# Patient Record
Sex: Male | Born: 1955
Health system: Southern US, Community
[De-identification: ages and names within clinical notes are randomized; demographics above are authoritative.]

## PROBLEM LIST (undated history)

## (undated) DIAGNOSIS — M1990 Unspecified osteoarthritis, unspecified site: Secondary | ICD-10-CM

## (undated) DIAGNOSIS — I209 Angina pectoris, unspecified: Secondary | ICD-10-CM

## (undated) DIAGNOSIS — J449 Chronic obstructive pulmonary disease, unspecified: Secondary | ICD-10-CM

## (undated) DIAGNOSIS — C801 Malignant (primary) neoplasm, unspecified: Secondary | ICD-10-CM

## (undated) DIAGNOSIS — N189 Chronic kidney disease, unspecified: Secondary | ICD-10-CM

## (undated) DIAGNOSIS — C61 Malignant neoplasm of prostate: Secondary | ICD-10-CM

## (undated) DIAGNOSIS — G4730 Sleep apnea, unspecified: Secondary | ICD-10-CM

## (undated) DIAGNOSIS — M109 Gout, unspecified: Secondary | ICD-10-CM

## (undated) DIAGNOSIS — R252 Cramp and spasm: Secondary | ICD-10-CM

## (undated) DIAGNOSIS — I7300 Raynaud's syndrome without gangrene: Secondary | ICD-10-CM

## (undated) DIAGNOSIS — Z87442 Personal history of urinary calculi: Secondary | ICD-10-CM

## (undated) DIAGNOSIS — E785 Hyperlipidemia, unspecified: Secondary | ICD-10-CM

## (undated) HISTORY — DX: Gout, unspecified: M10.9

## (undated) HISTORY — PX: OTHER SURGICAL HISTORY: SHX169

## (undated) HISTORY — DX: Hyperlipidemia, unspecified: E78.5

## (undated) HISTORY — PX: CARDIAC CATHETERIZATION: SHX172

## (undated) HISTORY — DX: Unspecified osteoarthritis, unspecified site: M19.90

## (undated) HISTORY — DX: Sleep apnea, unspecified: G47.30

## (undated) HISTORY — DX: Angina pectoris, unspecified: I20.9

## (undated) HISTORY — PX: COLONOSCOPY: SHX174

## (undated) HISTORY — DX: Chronic obstructive pulmonary disease, unspecified: J44.9

## (undated) SURGERY — EGD (ESOPHAGOGASTRODUODENOSCOPY)
Anesthesia: Moderate Sedation

---

## 1998-03-17 DIAGNOSIS — C801 Malignant (primary) neoplasm, unspecified: Secondary | ICD-10-CM

## 1998-03-17 HISTORY — PX: OTHER SURGICAL HISTORY: SHX169

## 1998-03-17 HISTORY — DX: Malignant (primary) neoplasm, unspecified: C80.1

## 2000-10-05 ENCOUNTER — Encounter: Payer: Self-pay | Admitting: General Surgery

## 2000-10-05 ENCOUNTER — Ambulatory Visit (HOSPITAL_COMMUNITY): Admission: RE | Admit: 2000-10-05 | Discharge: 2000-10-05 | Payer: Self-pay | Admitting: General Surgery

## 2000-10-09 ENCOUNTER — Emergency Department (HOSPITAL_COMMUNITY): Admission: EM | Admit: 2000-10-09 | Discharge: 2000-10-09 | Payer: Self-pay | Admitting: Emergency Medicine

## 2000-10-09 ENCOUNTER — Encounter: Payer: Self-pay | Admitting: Urology

## 2000-10-21 ENCOUNTER — Ambulatory Visit (HOSPITAL_COMMUNITY): Admission: RE | Admit: 2000-10-21 | Discharge: 2000-10-21 | Payer: Self-pay | Admitting: Urology

## 2000-10-21 ENCOUNTER — Encounter: Payer: Self-pay | Admitting: Urology

## 2000-10-26 ENCOUNTER — Ambulatory Visit (HOSPITAL_COMMUNITY): Admission: RE | Admit: 2000-10-26 | Discharge: 2000-10-26 | Payer: Self-pay | Admitting: Urology

## 2000-10-26 ENCOUNTER — Encounter: Payer: Self-pay | Admitting: Urology

## 2000-11-05 ENCOUNTER — Emergency Department (HOSPITAL_COMMUNITY): Admission: EM | Admit: 2000-11-05 | Discharge: 2000-11-05 | Payer: Self-pay | Admitting: Emergency Medicine

## 2000-11-26 ENCOUNTER — Ambulatory Visit (HOSPITAL_COMMUNITY): Admission: RE | Admit: 2000-11-26 | Discharge: 2000-11-26 | Payer: Self-pay | Admitting: Urology

## 2000-11-26 ENCOUNTER — Encounter: Payer: Self-pay | Admitting: Urology

## 2000-11-27 ENCOUNTER — Encounter: Payer: Self-pay | Admitting: Urology

## 2000-11-27 ENCOUNTER — Ambulatory Visit (HOSPITAL_COMMUNITY): Admission: RE | Admit: 2000-11-27 | Discharge: 2000-11-27 | Payer: Self-pay | Admitting: Urology

## 2002-02-15 ENCOUNTER — Ambulatory Visit (HOSPITAL_COMMUNITY): Admission: RE | Admit: 2002-02-15 | Discharge: 2002-02-15 | Payer: Self-pay | Admitting: Internal Medicine

## 2002-09-17 ENCOUNTER — Encounter: Payer: Self-pay | Admitting: Internal Medicine

## 2002-09-17 ENCOUNTER — Encounter: Payer: Self-pay | Admitting: Emergency Medicine

## 2002-09-17 ENCOUNTER — Observation Stay (HOSPITAL_COMMUNITY): Admission: EM | Admit: 2002-09-17 | Discharge: 2002-09-18 | Payer: Self-pay | Admitting: Emergency Medicine

## 2003-02-01 ENCOUNTER — Ambulatory Visit (HOSPITAL_COMMUNITY): Admission: RE | Admit: 2003-02-01 | Discharge: 2003-02-01 | Payer: Self-pay | Admitting: Family Medicine

## 2003-06-22 ENCOUNTER — Emergency Department (HOSPITAL_COMMUNITY): Admission: EM | Admit: 2003-06-22 | Discharge: 2003-06-22 | Payer: Self-pay | Admitting: Emergency Medicine

## 2004-08-20 ENCOUNTER — Ambulatory Visit (HOSPITAL_COMMUNITY): Admission: RE | Admit: 2004-08-20 | Discharge: 2004-08-20 | Payer: Self-pay | Admitting: Family Medicine

## 2004-10-02 ENCOUNTER — Ambulatory Visit: Payer: Self-pay | Admitting: Orthopedic Surgery

## 2004-10-03 ENCOUNTER — Ambulatory Visit (HOSPITAL_COMMUNITY): Admission: RE | Admit: 2004-10-03 | Discharge: 2004-10-03 | Payer: Self-pay | Admitting: Orthopedic Surgery

## 2004-10-07 ENCOUNTER — Ambulatory Visit: Payer: Self-pay | Admitting: Orthopedic Surgery

## 2004-11-04 ENCOUNTER — Ambulatory Visit: Payer: Self-pay | Admitting: Orthopedic Surgery

## 2004-11-22 ENCOUNTER — Encounter: Admission: RE | Admit: 2004-11-22 | Discharge: 2004-11-22 | Payer: Self-pay | Admitting: Orthopedic Surgery

## 2004-12-09 ENCOUNTER — Encounter: Admission: RE | Admit: 2004-12-09 | Discharge: 2004-12-09 | Payer: Self-pay | Admitting: Orthopedic Surgery

## 2005-04-07 ENCOUNTER — Ambulatory Visit: Payer: Self-pay | Admitting: Orthopedic Surgery

## 2005-06-02 ENCOUNTER — Ambulatory Visit (HOSPITAL_COMMUNITY): Admission: RE | Admit: 2005-06-02 | Discharge: 2005-06-02 | Payer: Self-pay | Admitting: Neurosurgery

## 2005-06-03 ENCOUNTER — Encounter (HOSPITAL_COMMUNITY): Admission: RE | Admit: 2005-06-03 | Discharge: 2005-07-03 | Payer: Self-pay | Admitting: Neurosurgery

## 2005-07-07 ENCOUNTER — Encounter (HOSPITAL_COMMUNITY): Admission: RE | Admit: 2005-07-07 | Discharge: 2005-08-06 | Payer: Self-pay | Admitting: Neurosurgery

## 2005-08-04 ENCOUNTER — Ambulatory Visit (HOSPITAL_COMMUNITY): Admission: RE | Admit: 2005-08-04 | Discharge: 2005-08-04 | Payer: Self-pay | Admitting: Neurosurgery

## 2006-07-21 IMAGING — CT CT L SPINE W/ CM
4 of 12 series · 10 of 33 positions shown, 11 images · IV contrast (omnipaque)
Comparison: none

CLINICAL DATA: Right L5 radiculopathy.  Normal MRI.
 LUMBAR MYELOGRAM:
TECHNIQUE: Lumbar puncture was performed by Dr. Klever at L4-5 from a left paramedian approach.  Fluid was thin and colorless.  15 cc Omnipaque 180 was instilled into the subarachnoid space.
TECHNIQUE: Multidetector CT imaging of the lumbar spine was performed after intrathecal injection of contrast.  Multiplanar CT image reconstructions were also generated.  The lumbar spine was examined from T12 downward.

[Series 2: l-spine helical · axial · 0.27mm/px · z∈[-300,-210]mm · 2 of 110 slices shown, 3 images]
[im 37/110  soft-tissue]
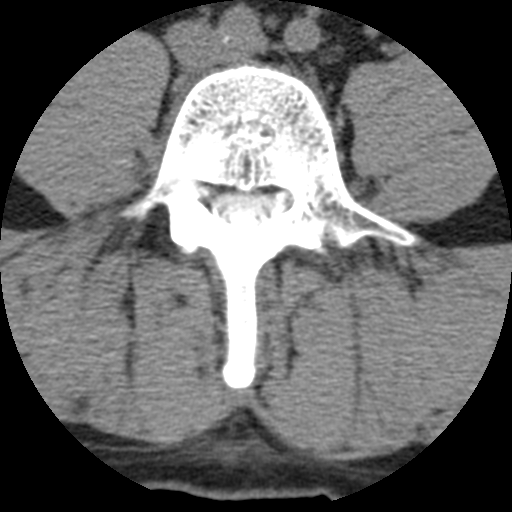
[im 37/110  bone]
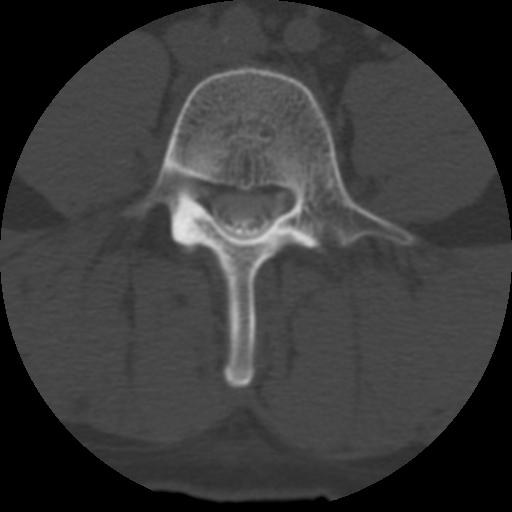
[im 73/110  bone]
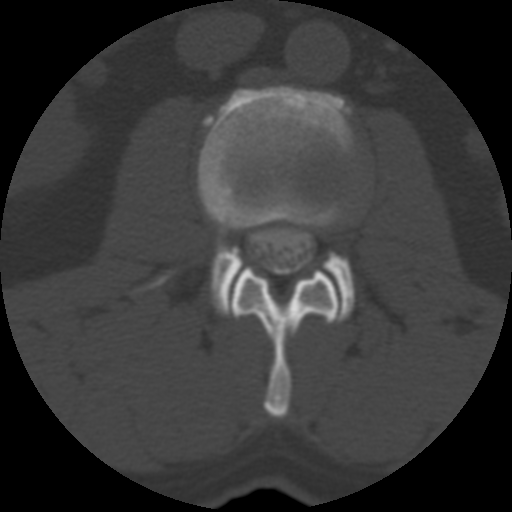

[Series 3: recon 2: l-spine helical · axial · 0.27mm/px · z∈[-300,-210]mm · 2 of 110 slices shown]
[im 37/110  bone]
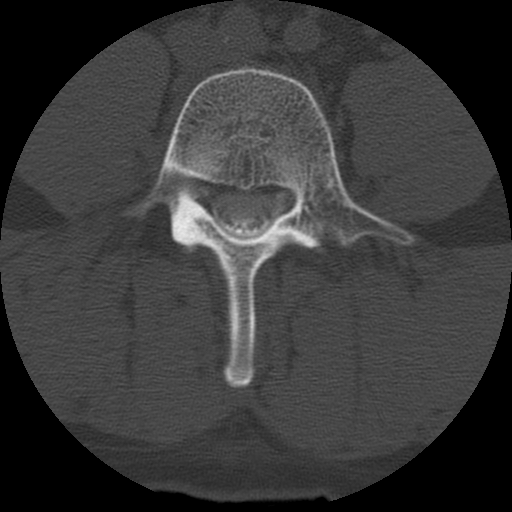
[im 73/110  bone]
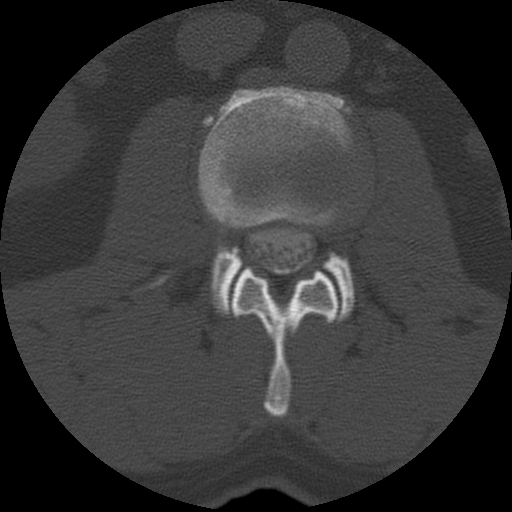

[Series 103: reformatted · sagittal · 0.55mm/px · 5 of 55 slices shown (1 of 2)]
[im 10/55  bone]
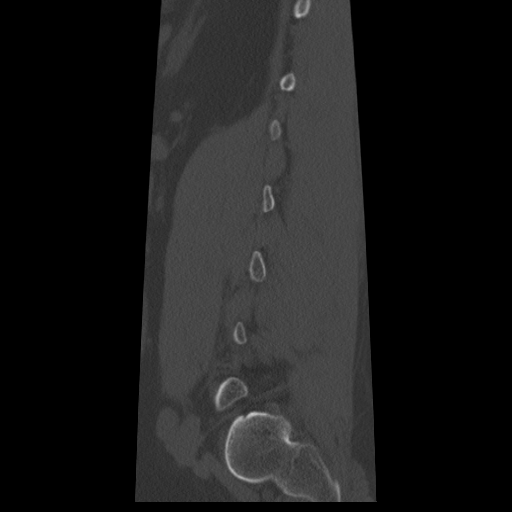
[im 19/55  bone]
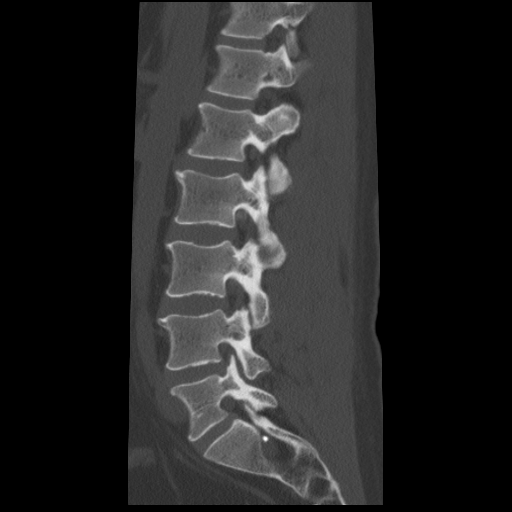
[im 28/55  bone]
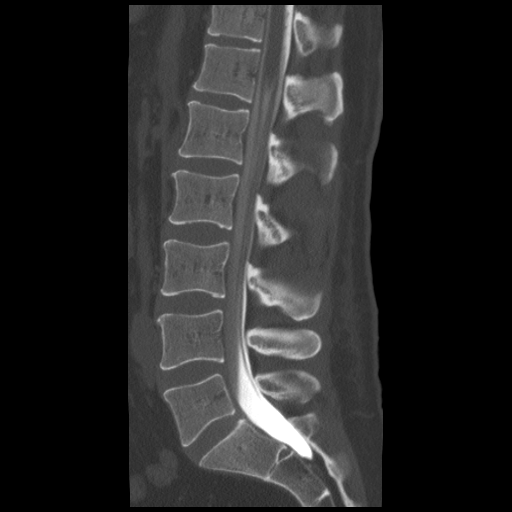
[im 37/55  bone]
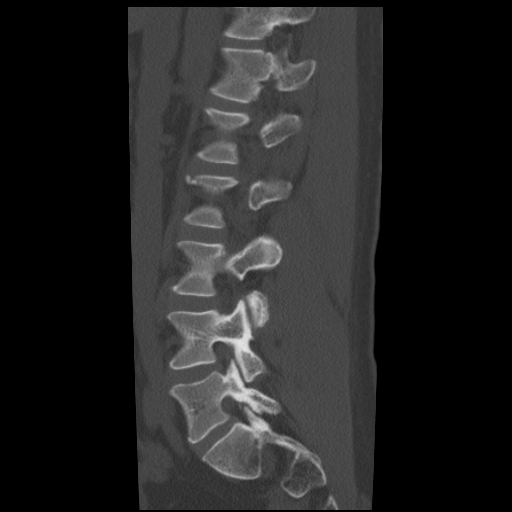
[im 46/55  bone]
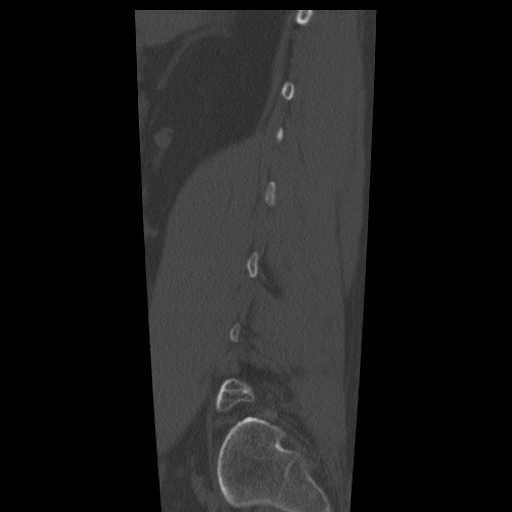

[Series 104: reformatted · coronal · 0.55mm/px · 1 of 55 slices shown (2 of 2)]
[im 28/55  bone]
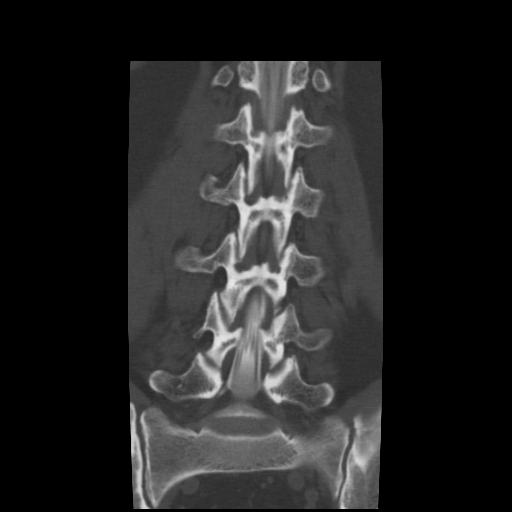

[10 of 33 positions shown; findings below may reference images not displayed]

FINDINGS: AP, lateral, and oblique views show no nerve root cutoff, spinal stenosis, or other extradural defect.  Alignment is satisfactory.  No osseous lesions are identified.
IMPRESSION: Unremarkable lumbar myelography.
 CT LUMBAR SPINE WITH CONTRAST (POST-MYELOGRAM):
FINDINGS: T12-L1:  Normal interspace. Conus normal.
 L1-2:  Normal interspace. 
 L2-3:  Normal interspace. 
 L3-4:  Normal interspace. 
 L4-5:  Normal interspace.
 L5-S1:  No stenosis or disk protrusion.  
 Far right and left parasagittal images show no foraminal narrowing or extraforaminal protrusions.
IMPRESSION: Unremarkable post-myelogram CT of the lumbar spine.

## 2007-05-30 ENCOUNTER — Encounter: Payer: Self-pay | Admitting: Orthopedic Surgery

## 2007-11-16 ENCOUNTER — Encounter: Payer: Self-pay | Admitting: Orthopedic Surgery

## 2008-01-10 ENCOUNTER — Ambulatory Visit (HOSPITAL_COMMUNITY): Admission: RE | Admit: 2008-01-10 | Discharge: 2008-01-10 | Payer: Self-pay | Admitting: Urology

## 2008-02-14 ENCOUNTER — Ambulatory Visit (HOSPITAL_COMMUNITY): Admission: RE | Admit: 2008-02-14 | Discharge: 2008-02-14 | Payer: Self-pay | Admitting: Urology

## 2008-03-01 ENCOUNTER — Ambulatory Visit (HOSPITAL_COMMUNITY): Admission: RE | Admit: 2008-03-01 | Discharge: 2008-03-01 | Payer: Self-pay | Admitting: Urology

## 2008-03-07 ENCOUNTER — Ambulatory Visit (HOSPITAL_COMMUNITY): Admission: RE | Admit: 2008-03-07 | Discharge: 2008-03-07 | Payer: Self-pay | Admitting: Urology

## 2008-05-01 ENCOUNTER — Ambulatory Visit (HOSPITAL_COMMUNITY): Admission: RE | Admit: 2008-05-01 | Discharge: 2008-05-02 | Payer: Self-pay | Admitting: Urology

## 2008-07-25 ENCOUNTER — Ambulatory Visit (HOSPITAL_COMMUNITY): Admission: RE | Admit: 2008-07-25 | Discharge: 2008-07-25 | Payer: Self-pay | Admitting: Internal Medicine

## 2008-07-26 ENCOUNTER — Ambulatory Visit (HOSPITAL_COMMUNITY): Admission: RE | Admit: 2008-07-26 | Discharge: 2008-07-26 | Payer: Self-pay | Admitting: General Surgery

## 2008-09-26 ENCOUNTER — Ambulatory Visit (HOSPITAL_COMMUNITY): Payer: Self-pay | Admitting: Psychiatry

## 2008-10-03 ENCOUNTER — Ambulatory Visit (HOSPITAL_COMMUNITY): Payer: Self-pay | Admitting: Psychology

## 2008-10-10 ENCOUNTER — Ambulatory Visit (HOSPITAL_COMMUNITY): Payer: Self-pay | Admitting: Psychiatry

## 2008-10-16 ENCOUNTER — Ambulatory Visit (HOSPITAL_COMMUNITY): Payer: Self-pay | Admitting: Psychology

## 2008-10-19 ENCOUNTER — Emergency Department (HOSPITAL_COMMUNITY): Admission: EM | Admit: 2008-10-19 | Discharge: 2008-10-19 | Payer: Self-pay | Admitting: Emergency Medicine

## 2008-11-06 ENCOUNTER — Ambulatory Visit (HOSPITAL_COMMUNITY): Payer: Self-pay | Admitting: Psychology

## 2008-11-07 ENCOUNTER — Ambulatory Visit (HOSPITAL_COMMUNITY): Payer: Self-pay | Admitting: Psychiatry

## 2008-12-07 ENCOUNTER — Ambulatory Visit (HOSPITAL_COMMUNITY): Payer: Self-pay | Admitting: Psychology

## 2008-12-21 ENCOUNTER — Ambulatory Visit (HOSPITAL_COMMUNITY): Payer: Self-pay | Admitting: Psychiatry

## 2009-01-30 IMAGING — CR DG ABDOMEN 1V
1 series · 1 of 1 positions shown · non-contrast
Comparison: 01/10/2008

CLINICAL DATA: Right renal calculus

ABDOMEN - 1 VIEW

[view not recorded]
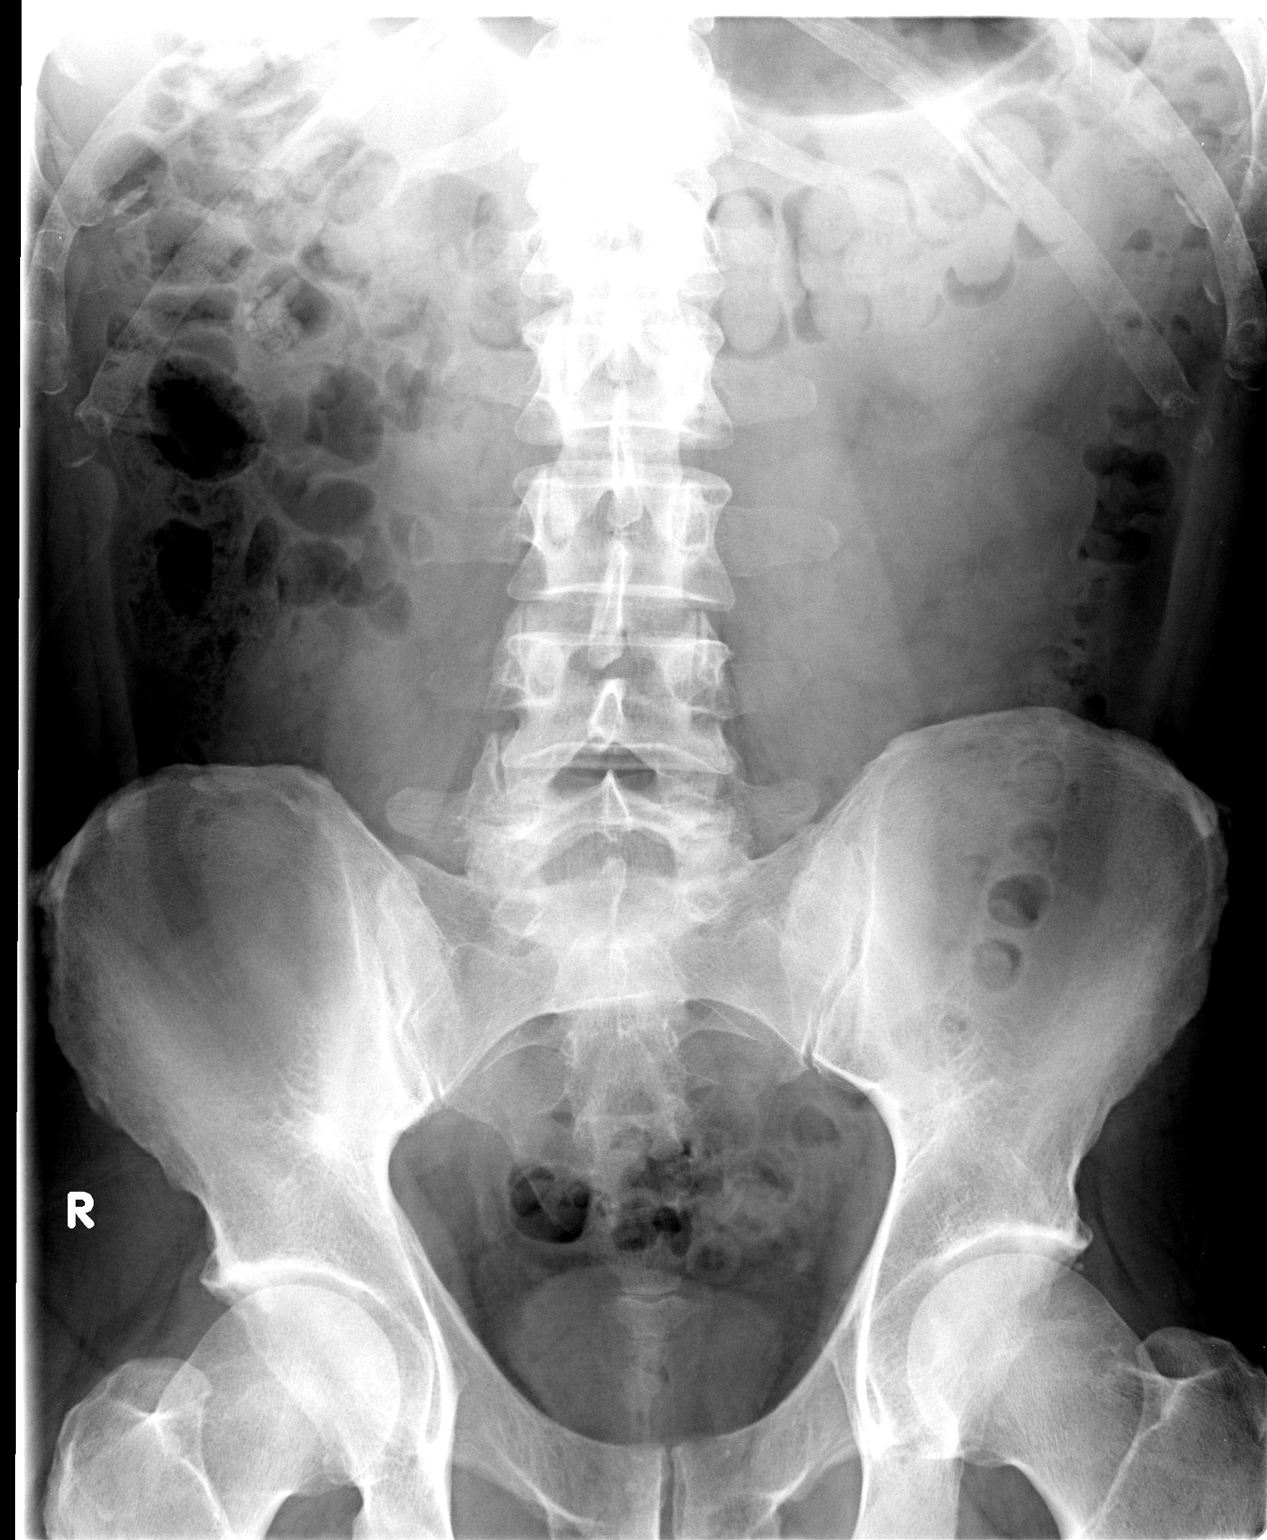

[1 of 1 positions shown; findings below may reference images not displayed]

FINDINGS: Bilateral nephrolithiasis is stable.  No
disproportionally dilated loops of small bowel.  Stool throughout
the colon is present.  No obvious free intraperitoneal gas.
IMPRESSION: Bilateral nephrolithiasis.

## 2009-02-19 ENCOUNTER — Ambulatory Visit (HOSPITAL_COMMUNITY): Payer: Self-pay | Admitting: Psychology

## 2009-02-20 ENCOUNTER — Ambulatory Visit (HOSPITAL_COMMUNITY): Payer: Self-pay | Admitting: Psychiatry

## 2009-05-14 ENCOUNTER — Ambulatory Visit (HOSPITAL_COMMUNITY): Payer: Self-pay | Admitting: Psychology

## 2009-06-11 ENCOUNTER — Ambulatory Visit (HOSPITAL_COMMUNITY): Payer: Self-pay | Admitting: Psychology

## 2009-07-11 IMAGING — CR DG HAND COMPLETE 3+V*R*
2 series · 2 of 2 positions shown · non-contrast
Comparison: None

CLINICAL DATA: Injury right hand, penetrating injury, metal shard

RIGHT HAND - COMPLETE 3+ VIEW

[view not recorded (1 of 2)]
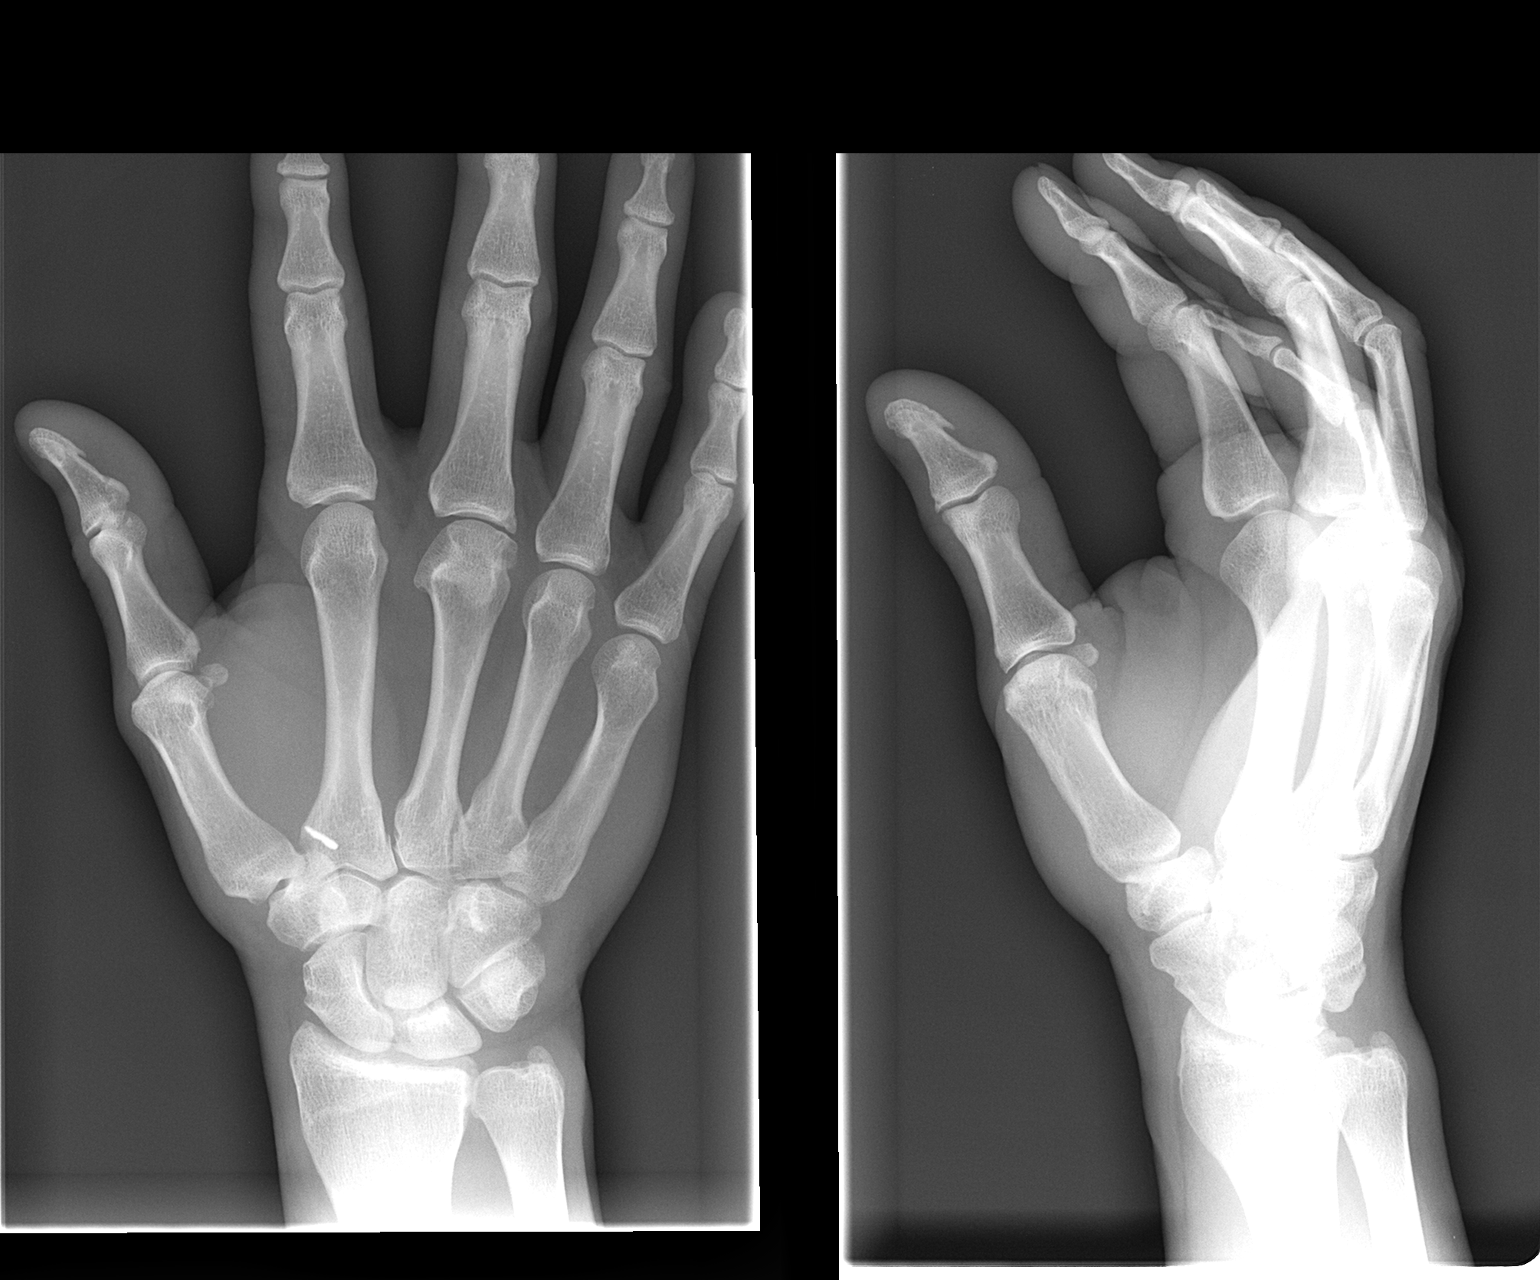

[view not recorded (2 of 2)]
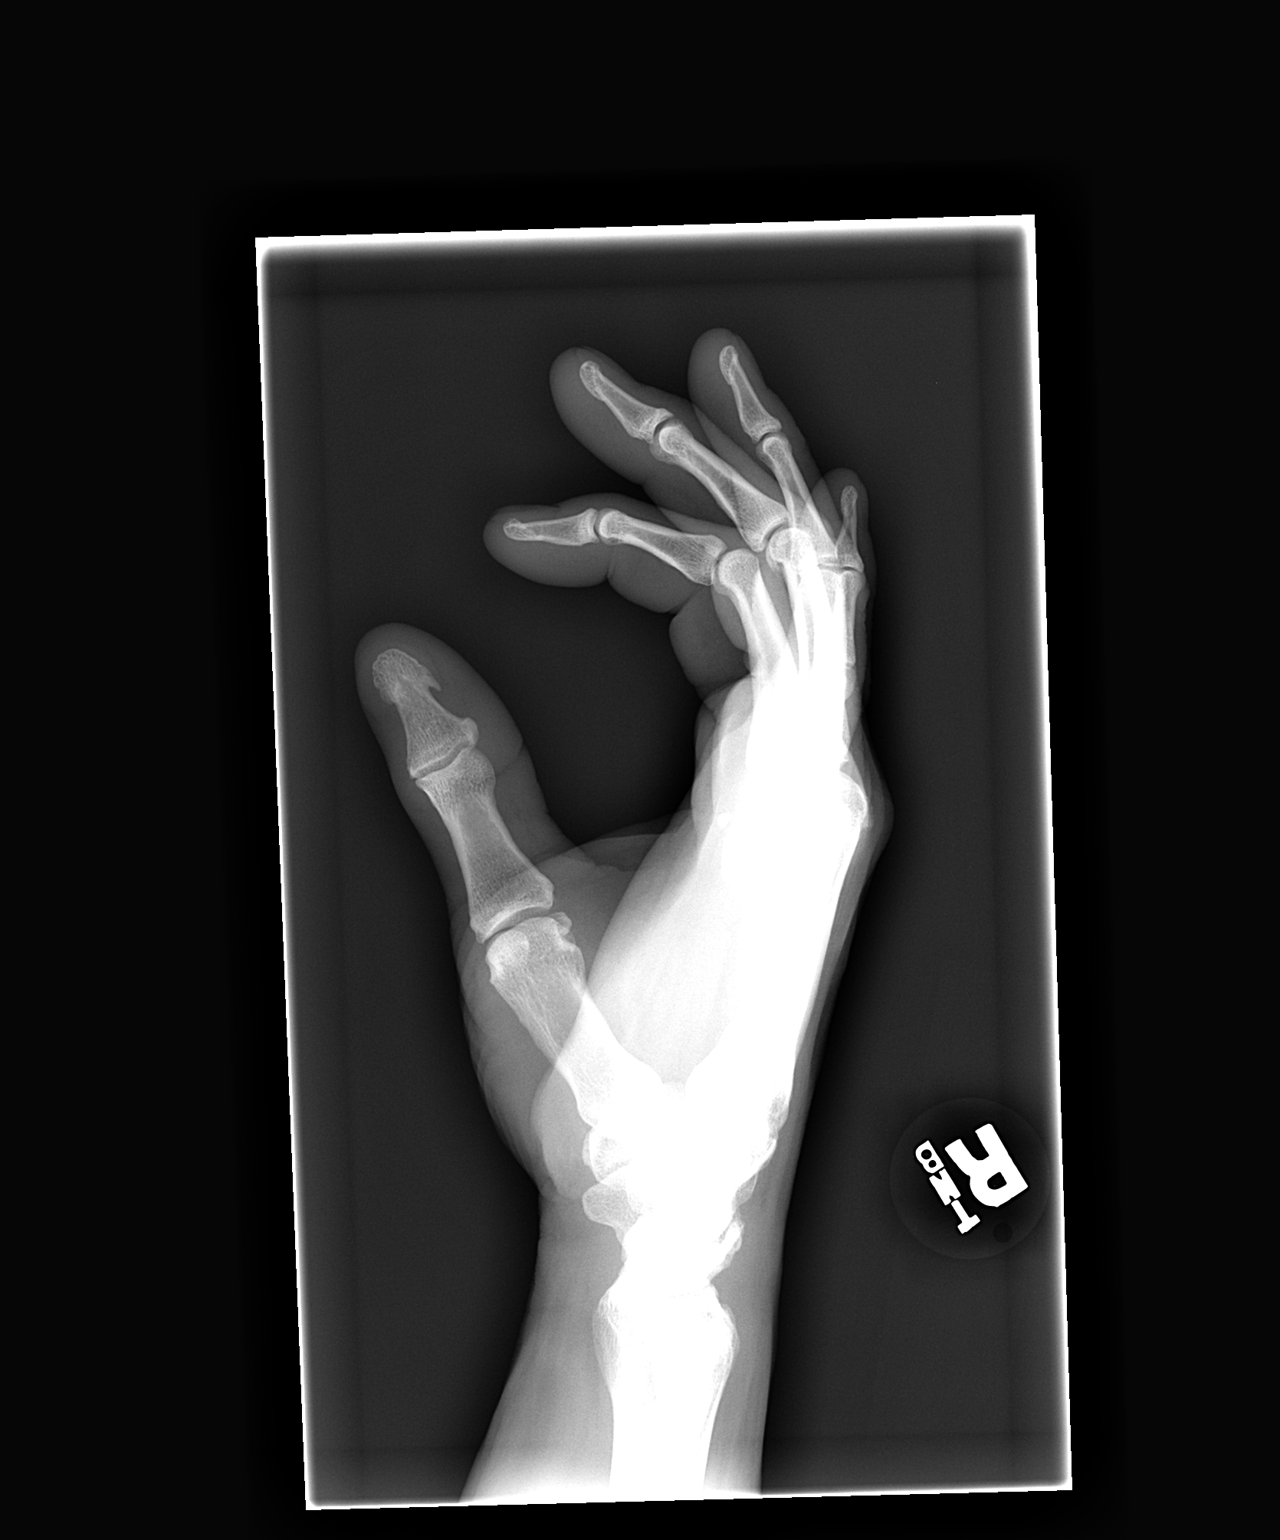

[2 of 2 positions shown; findings below may reference images not displayed]

FINDINGS: Metallic foreign body identified at radial aspect, base of second
metacarpal.
Observed foreign body measures 1.4 mm thick and 7.6 mm length.
No definite fracture, dislocation, or bone destruction.
From the obtained images, it is uncertain if the metallic fragment
is within or immediately adjacent to the base of the second
metacarpal.
Spurring versus post-traumatic deformity of third metacarpal head.
Remaining soft tissues unremarkable.
Bone mineralization normal.
IMPRESSION: Metallic foreign body at base of right second metacarpal as above.

## 2009-09-11 ENCOUNTER — Ambulatory Visit (HOSPITAL_COMMUNITY): Payer: Self-pay | Admitting: Psychology

## 2009-10-05 ENCOUNTER — Encounter: Payer: Self-pay | Admitting: Emergency Medicine

## 2009-10-05 ENCOUNTER — Emergency Department (HOSPITAL_COMMUNITY): Admission: EM | Admit: 2009-10-05 | Discharge: 2009-10-05 | Payer: Self-pay | Admitting: Emergency Medicine

## 2009-12-10 ENCOUNTER — Ambulatory Visit (HOSPITAL_COMMUNITY): Payer: Self-pay | Admitting: Psychology

## 2010-06-01 LAB — DIFFERENTIAL
Basophils Absolute: 0 10*3/uL (ref 0.0–0.1)
Eosinophils Relative: 2 % (ref 0–5)
Neutro Abs: 3.5 10*3/uL (ref 1.7–7.7)
Neutrophils Relative %: 56 % (ref 43–77)

## 2010-06-01 LAB — BASIC METABOLIC PANEL
BUN: 14 mg/dL (ref 6–23)
Chloride: 103 mEq/L (ref 96–112)
GFR calc Af Amer: >60 mL/min (ref >60)
GFR calc non Af Amer: 58 mL/min — ABNORMAL LOW (ref >60)
Sodium: 136 mEq/L (ref 135–145)

## 2010-06-01 LAB — CBC
MCV: 88.9 fL (ref 78.0–100.0)
Platelets: 273 10*3/uL (ref 150–400)
RBC: 5.35 MIL/uL (ref 4.22–5.81)
WBC: 6.2 10*3/uL (ref 4.0–10.5)

## 2010-06-25 LAB — BASIC METABOLIC PANEL
CO2: 29 mEq/L (ref 19–32)
Chloride: 104 mEq/L (ref 96–112)
GFR calc Af Amer: >60 mL/min (ref >60)
Glucose, Bld: 153 mg/dL — ABNORMAL HIGH (ref 70–99)
Sodium: 139 mEq/L (ref 135–145)

## 2010-06-25 LAB — GLUCOSE, CAPILLARY: Glucose-Capillary: 95 mg/dL (ref 70–99)

## 2010-06-25 LAB — CBC
HCT: 43.3 % (ref 39.0–52.0)
Hemoglobin: 15.1 g/dL (ref 13.0–17.0)
Platelets: 251 10*3/uL (ref 150–400)
RBC: 4.93 MIL/uL (ref 4.22–5.81)
RDW: 13.7 % (ref 11.5–15.5)
WBC: 5.7 10*3/uL (ref 4.0–10.5)

## 2010-07-02 LAB — CBC
HCT: 43.6 % (ref 39.0–52.0)
Hemoglobin: 15.1 g/dL (ref 13.0–17.0)
MCHC: 34.7 g/dL (ref 30.0–36.0)
MCV: 91.1 fL (ref 78.0–100.0)
Platelets: 280 10*3/uL (ref 150–400)
RBC: 4.78 MIL/uL (ref 4.22–5.81)
RDW: 13.5 % (ref 11.5–15.5)
WBC: 4.6 10*3/uL (ref 4.0–10.5)

## 2010-07-02 LAB — COMPREHENSIVE METABOLIC PANEL
ALT: 23 U/L (ref 0–53)
AST: 22 U/L (ref 0–37)
Albumin: 3.9 g/dL (ref 3.5–5.2)
Alkaline Phosphatase: 56 U/L (ref 39–117)
BUN: 11 mg/dL (ref 6–23)
CO2: 27 mEq/L (ref 19–32)
Calcium: 8.9 mg/dL (ref 8.4–10.5)
Chloride: 101 mEq/L (ref 96–112)
Creatinine, Ser: 1.22 mg/dL (ref 0.4–1.5)
GFR calc Af Amer: >60 mL/min (ref >60)
GFR calc non Af Amer: >60 mL/min (ref >60)
Glucose, Bld: 102 mg/dL — ABNORMAL HIGH (ref 70–99)
Potassium: 3.6 mEq/L (ref 3.5–5.1)
Sodium: 136 mEq/L (ref 135–145)
Total Bilirubin: 1.2 mg/dL (ref 0.3–1.2)
Total Protein: 6 g/dL (ref 6.0–8.3)

## 2010-07-02 LAB — URINALYSIS, ROUTINE W REFLEX MICROSCOPIC
Bilirubin Urine: NEGATIVE
Glucose, UA: NEGATIVE mg/dL
Hgb urine dipstick: NEGATIVE
Ketones, ur: NEGATIVE mg/dL
Nitrite: NEGATIVE
Protein, ur: NEGATIVE mg/dL
Specific Gravity, Urine: 1.019 (ref 1.005–1.030)
Urobilinogen, UA: 0.2 mg/dL (ref 0.0–1.0)
pH: 7 (ref 5.0–8.0)

## 2010-07-02 LAB — APTT: aPTT: 28 seconds (ref 24–37)

## 2010-07-02 LAB — PROTIME-INR
INR: 1 (ref 0.00–1.49)
Prothrombin Time: 13.5 seconds (ref 11.6–15.2)

## 2010-07-30 NOTE — H&P (Signed)
NAME:  Jordan Roth, Jordan Roth               ACCOUNT NO.:  [id number]   MEDICAL RECORD NO.:  [id number]          PATIENT TYPE:  AMB   LOCATION:  DAY                           FACILITY:  [facility]   PHYSICIAN:  Ky Barban, M.D.DATE OF BIRTH:  [birth year]   DATE OF ADMISSION:  DATE OF DISCHARGE:  LH                              HISTORY & PHYSICAL   This patient is coming tomorrow to undergo ESL for his right kidney  calculus.  This patient was treated here.  He had an ESL done for the  right renal calculus, and it is not fragmented.  We are going try to do  ESL tomorrow.   Please use the previous history and physical on this patient.      Ky Barban, M.D.  Electronically Signed     MIJ/MEDQ  D:  02/29/2008  T:  03/01/2008  Job:  [id number]

## 2010-07-30 NOTE — H&P (Signed)
[name], Roth               ACCOUNT NO.:  [id number]   MEDICAL RECORD NO.:  [id number]          PATIENT TYPE:  AMB   LOCATION:  DAY                           FACILITY:  [facility]   PHYSICIAN:  Dalia Heading, M.D.  DATE OF BIRTH:  Jul 11, 1955   DATE OF ADMISSION:  07/25/2008  DATE OF DISCHARGE:  LH                              HISTORY & PHYSICAL   CHIEF COMPLAINT:  Foreign body, right hand.   HISTORY OF PRESENT ILLNESS:  The patient is a 55 year old white male who  is referred for evaluation and treatment of a foreign body in the right  hand.  He sustained a piece of metal while hammering.  Swelling and pain  are present at the base of the thumb in the snuff-box region.  A foreign  body is confirmed by x-ray.  His tetanus is up to date.   PAST MEDICAL HISTORY:  Unremarkable.   PAST SURGICAL HISTORY:  Unremarkable.   CURRENT MEDICATIONS:  None.   ALLERGIES:  No known drug allergies.   REVIEW OF SYSTEMS:  Noncontributory.   PHYSICAL EXAMINATION:  GENERAL:  The patient is a well-developed, well-  nourished, white male in no acute distress.  LUNGS:  Clear to auscultation with equal breath sounds bilaterally.  HEART:  Reveals regular rate and rhythm without S3, S4, or murmurs.  Right hand has swelling at the base of the thumb in the snuff-box region  where a puncture wound is present.  No foreign body is palpated.   IMPRESSION:  Foreign body, right hand.   PLAN:  The patient is scheduled for removal of the foreign body, right  hand, under C-arm guidance on Jul 26, 2008.  The risks and benefits of  the procedure including bleeding and infection were fully explained to  the patient, who gave informed consent.      Dalia Heading, M.D.  Electronically Signed     MAJ/MEDQ  D:  07/25/2008  T:  07/25/2008  Job:  [id number]   cc:   [facility]  Fax: [phone]   Madelin Rear. Sherwood Gambler, MD  Fax: [phone]

## 2010-07-30 NOTE — Op Note (Signed)
Jordan Roth, Jordan Roth               ACCOUNT NO.:  [id number]   MEDICAL RECORD NO.:  [id number]          PATIENT TYPE:  OIB   LOCATION:  0098                         FACILITY:  [facility]   PHYSICIAN:  Mark C. Vernie Ammons, M.D.  DATE OF BIRTH:  1955/08/27   DATE OF PROCEDURE:  05/01/2008  DATE OF DISCHARGE:                               OPERATIVE REPORT   PREOPERATIVE DIAGNOSIS:  Right renal calculi.   POSTOPERATIVE DIAGNOSIS:  Right renal calculi.   PROCEDURE:  1. Right percutaneous nephrostomy tract dilation and placement of      percutaneous nephrostomy access sheath.  2. Right percutaneous nephrostolithotomy with stone extraction.  3. Antegrade nephrostogram with interpretation.   SURGEON:  Mark C. Vernie Ammons, M.D.   ANESTHESIA:  General.   BLOOD LOSS:  Approximately 100 mL.   SPECIMENS:  Stone given to the patient.   DRAINS:  20-French council tip catheter as a nephrostomy tube and an 36-  French Foley catheter per urethra.   COMPLICATIONS:  None.   INDICATIONS:  The patient is a 55 year old male who has known history of  bilateral renal calculi and has undergone 4 previous lithotripsies.  The  most recent lithotripsy was in 02/2008 and despite that, he continued to  have stone in the lower pole of his right kidney approximately 2 cm in  size by CT extending into the area of the renal pelvis with intermittent  pain.  Because of the stone's location in the lower pole calix, its  previous treatment with lithotripsy unsuccessfully and he has pain.  I  have discussed with the patient percutaneous nephrostolithotomy.  I went  over the procedure as well as its risks and complications and he  understands and has elected to proceed.   DESCRIPTION OF OPERATION:  After informed consent, the patient was taken  to the major OR, placed on the table and administered general  anesthesia.  He received Cipro 400 mg IV preoperatively.  Once full  general anesthesia was obtained, he was placed in  the prone position.  He had a nephrostomy catheter placed earlier in the day by  interventional radiology in the lower pole and this catheter as well as  the flank region was sterilely prepped and draped and an official time-  out was then performed.   I initially passed a 0.038 inch floppy tip guidewire through the  nephrostomy catheter under direct fluoroscopy into the area of the  bladder and confirmed this by fluoroscopy.  I then removed the  nephrostomy catheter and passed the coaxial catheter over the guidewire  again under direct fluoroscopy.  Next a second identically sized  guidewire was then passed again under fluoroscopy down into the bladder  and this was to serve as a safety guidewire.  The coaxial catheter was  removed and over the working guidewire, I passed the nephrostomy tract  dilating balloon into the area of the renal pelvis by fluoroscopic  guidance and then inflated this.  I passed the 28-French nephrostomy  access sheath over the balloon into the area of the lower pole calix and  then deflated the balloon  and removed it.   The 26-French rigid nephroscope was then passed through the access  sheath into the area lower pole and I began a systematic inspection.  I  was able to find the stone and the largest fragment was identified.  It  was grasped with three-pronged graspers and extracted.  Multiple other  smaller stones were seen and these were grasped with two-pronged  graspers.  This proceeded until all the visible stone was removed.  I  then found another area where a few more stones were identified and  these were also extracted.  I then systematically inspected the lower  pole calix and found no further stones.  I then passed the scope in the  area of the renal pelvis and again noted no stones in this location.  I  could see the guidewires passing down the ureter with no stones passing  down the ureter identified.   I removed the nephroscope and placed a  Foley catheter in the access  sheath and inflated the balloon and then injected full strength contrast  to perform an antegrade nephrostogram.  This was performed under direct  fluoroscopy injecting full strength contrast and I noted that the access  sheath was located at the area of the lower pole calix and I could not  definitely identify any further stones present.  I also noted no  evidence of extravasation of contrast.   I then deflated the balloon and advanced the catheter into the area of  the renal pelvis and confirmed this fluoroscopically by injecting  contrast.  I then filled the balloon with approximately 3 mL of half-  strength contrast in the area of the renal pelvis and confirmed this  again by injecting contrast through the nephrostomy tube.  The access  sheath was then backed out of the patient and incised along its length  and removed from the nephrostomy catheter which was then secured to the  skin with a figure-of-eight 2-0 silk suture.  This was connected to  closed system drainage and a sterile occlusive dressing was applied.  The patient was then taken to recovery room in stable satisfactory  condition.  He tolerated procedure well with no intraoperative  complications.   He will be observed overnight and his nephrostomy catheter will be  removed in the morning as long as no active bleeding is noted with plans  to discharge him home at that time.      Mark C. Vernie Ammons, M.D.  Electronically Signed     MCO/MEDQ  D:  05/01/2008  T:  05/01/2008  Job:  [id number]

## 2010-07-30 NOTE — Op Note (Signed)
NAMEDARRYN, KYDD               ACCOUNT NO.:  [id number]   MEDICAL RECORD NO.:  [id number]          PATIENT TYPE:  AMB   LOCATION:  DAY                           FACILITY:  [facility]   PHYSICIAN:  Dalia Heading, M.D.  DATE OF BIRTH:  1955-11-14   DATE OF PROCEDURE:  07/26/2008  DATE OF DISCHARGE:                               OPERATIVE REPORT   PREOPERATIVE DIAGNOSIS:  Foreign body, right hand.   POSTOPERATIVE DIAGNOSIS:  Foreign body, right hand.   PROCEDURE:  Removal of foreign body, right hand.   SURGEON:  Dalia Heading, MD   ANESTHESIA:  Regional.   INDICATIONS:  The patient is a 55 year old white male who presented with  a metallic foreign body in the right hand.  The patient now comes to the  operating room for removal of the foreign body under mini C-arm  guidance.  The risks and benefits of the procedure were fully explained  to the patient, gave informed consent.   PROCEDURE NOTE:  The patient was placed in the supine position.  After  regional anesthesia was administered, the right hand was prepped and  draped using the usual sterile technique with Betadine.  Surgical site  confirmation was performed.   Using the mini C-arm, the foreign body was localized.  An incision was  made over this region and the dissection was taken down to near the  tendon bone structure in the snuffbox.  Care was taken to avoid any  injury to vessels or tendon.  The foreign body was found and removed  without difficulty.  It was going to be given to the patient upon  discharge.  The wound was irrigated with normal saline.  Xylocaine 1%  was used for local anesthesia.  The tourniquet time was 33 minutes.  Bleeding was controlled using Bovie electrocautery.  The skin was closed  using 4-0 Prolene interrupted sutures.  Betadine ointment and dry  sterile dressing were applied.   All tape and needle counts were correct at the end of the procedure.  The patient was transferred to PACU in  stable condition.   COMPLICATIONS:  None.   SPECIMEN:  None.   BLOOD LOSS:  Minimal.      Dalia Heading, M.D.  Electronically Signed     MAJ/MEDQ  D:  07/26/2008  T:  07/26/2008  Job:  [id number]   cc:   Madelin Rear. Sherwood Gambler, MD  Fax: [phone]

## 2010-08-02 NOTE — H&P (Signed)
NAME:  Jordan Roth, Jordan Roth                         ACCOUNT NO.:  [id number]   MEDICAL RECORD NO.:  [id number]                   PATIENT TYPE:  INP   LOCATION:  A220                                 FACILITY:  [facility]   PHYSICIAN:  Madelin Rear. Sherwood Gambler, M.D.             DATE OF BIRTH:  30-Jul-1955   DATE OF ADMISSION:  09/16/2002  DATE OF DISCHARGE:                                HISTORY & PHYSICAL   CHIEF COMPLAINT:  Syncope.   HISTORY OF PRESENT ILLNESS:  Patient was in his usual state of health,  feeling fine, ate some dinner and had several drinks approximately 4-5 of  different alcoholic beverages.  He states that coincidental with possibly  some epigastric severe abdominal pain he had a short episode of small emesis  and had two possibly three separate syncopal episodes described as follows.  He had the syncopal episodes after making a telephone call and arising from  a chair where he did completely go unconscious.  He got up and then  developed some epigastric abdominal pain and no chest pain or palpitations  or shortness of breath and then he had a true syncopal episode at that time.  He states he had a third episode and possibly a fourth en route via  ambulance.  The ACR sheet is not available for my review to confirm this.  During my actual presence the patient had minimal symptoms with epigastric  discomfort but no chest pain.  He states that the day before yesterday he  had some right ear pain and possibly a little bit of vertigo but that is  gone at the present time.  Otherwise he denies any other discomfort.   PAST MEDICAL HISTORY:  Status post Barrett's esophagitis and fundoplication  for severe gastroesophageal reflux disease unresponsive to medical therapy.  He states the surgery was done about five years ago.  He has had no problem  since.   SOCIAL HISTORY:  He admits to alcohol intake.  He is married, lives with his  wife.   FAMILY HISTORY:  Noncontributory.   REVIEW OF  SYSTEMS:  Under HPI.  All negative.  Specifically denied any  hematemesis, hematochezia or melena.  No fever, rigors or chills.  No chest  pain or palpitations.   PHYSICAL EXAMINATION:  GENERAL:  He is awake, alert and appears comfortable.  HEAD/NECK:  Show no JVD or adenopathy.  Neck was supple.  CHEST:  Clear.  CARDIAC:  Regular rhythm without murmur, gallop or rub.  ABDOMEN:  Soft.  There is no organomegaly or masses.  No guarding or rebound  tenderness.  EXTREMITIES:  Without clubbing, cyanosis or edema.  NEUROLOGIC:  Nonfocal.   Acute abdominal series was reviewed and reveals a nonspecific gas pattern  without any evidence of obstruction.  Chest x-ray was clear.   EKG was normal.   Electrolytes and CBC were unrevealing.  Amylase and lipase normal.  A  liver  profile is pending at the present time as are his cardiac enzymes.   IMPRESSION:  Syncope superimposed on abdominal pain probably vagal.   Patient will be admitted with serial cardiac enzymes, telemetry monitoring,  rule out of a GI bleed and/or alcoholic gastritis, symptomatic therapy,  supportive measures, consultation of gastroenterology is anticipated as well  as following hemoccult stools.                                                Madelin Rear. Sherwood Gambler, M.D.    LJF/MEDQ  D:  09/17/2002  T:  09/17/2002  Job:  [id number]

## 2010-08-02 NOTE — Consult Note (Signed)
NAME:  Jordan, Roth                         ACCOUNT NO.:  [id number]   MEDICAL RECORD NO.:  [id number]                   PATIENT TYPE:  INP   LOCATION:  A220                                 FACILITY:  [facility]   PHYSICIAN:  Lionel December, M.D.                 DATE OF BIRTH:  [birth year]   DATE OF CONSULTATION:  09/17/2002  DATE OF DISCHARGE:                                   CONSULTATION   REASON FOR CONSULTATION:  Epigastric pain.   HISTORY OF PRESENT ILLNESS:  Jordan Roth is a 55 year old Caucasian male who was  admitted to Dr. Sharyon Medicus service earlier today via the emergency room where  he had presented around midnight, transported via EMS, ___________ with a  sudden onset of epigastric pain associated with nausea, heaving and  vomiting.  During this process he also had syncopal episodes.  Evaluation in  the emergency room revealed a normal troponin level and EKG.  His lab  studies were normal except mildly elevated AST.  He was hospitalized for  further management.  He has not had any rhythm problems or hypertension  since admission.  Patient states that he was fine all day yesterday.  He had  two hot dogs while at work.  He ate his supper later in the evening.  He had  a few drinks of alcohol.  At around 10:30 he noted epigastric pain which was  more or less sudden in onset and quite severe.  He felt very nauseated and  heaved multiple times and vomited a scant amount of food or fluid.  He  subsequently had at least two episodes of syncope.  Paramedics were called  and he was brought to the emergency room.  He has not had any more episodes  of syncope, nausea, vomiting.  He is sore in his epigastrium which he  believes is due to heaving and retching.  He was able to keep his breakfast  down.  Two days ago he noticed some right ear discomfort, however, he has  not had any headache, fever or chills.  He denies hematemesis, melena,  rectal bleeding, dysuria or hematuria.  He has a  history of kidney stones  but this pain is different then the pain that he had then.   He does not take any medications on a regular basis.  He is presently on  Protonix 40 mg IV daily and Phenergan 25 mg q.6h.   REVIEW OF SYSTEMS:  Negative for weight loss, heartburn, dysphagia, chest  pain or shortness of breath.   PAST MEDICAL HISTORY:  1. History of GERD.  2. He had lap Nissen at ___________ five years ago and he has done well.  3. He has had short segment Barrett's esophagus.  4. Last EGD was in December 2003 and biopsy was negative for dysplasia.  5. He has had laparoscopic cholecystectomy prior to his lap Nissen.  6. He has  had surgery on his right foot, right elbow.  7. He also had a tonsillectomy when he was 55 years old.  8. He has a history of kidney stones.  9. He had lithotripsy x 2 last year.  His stones were crushed but he did not     get rid of them.  He decided to just wait and see.   He saw Dr. Duanne Guess.  He decided he would not undergo further studies unless  his symptoms came back or he developed hematuria.   ALLERGIES:  None known.   FAMILY HISTORY:  Noncontributory.   SOCIAL HISTORY:  He is married.  He does Holiday representative work.  He does not  smoke cigarettes.  He drinks beer, not daily, but on Sundays he may drink as  many as 8, 9, 10 according to his wife; however, he feels that he drinks no  more then 6-7.   PHYSICAL EXAMINATION:  GENERAL:  A pleasant well-developed, well-nourished,  Caucasian male who is in no acute distress.  VITAL SIGNS:  He weighs 185.1 pounds.  He is 5 feet 10 inches tall.  Pulse  55 per minute.  Blood pressure 99/55.  Respirations 20 and temp is 97.2.  HEENT:  Conjunctivae pink.  Sclerae nonicteric.  Oropharyngeal mucosa is  normal.  NECK:  Without masses or thyromegaly.  CARDIAC:  Regular rate and normal S1, S2.  No murmur or gallop noted.  LUNGS:  Clear to auscultation.  ABDOMEN:  Symmetrical bowel sounds.  They are normal.  He  has mild  midepigastric tenderness on deep palpation.  No organomegaly or masses.  RECTAL:  Deferred.  EXTREMITIES:  He does not have clubbing or peripheral edema.   LABORATORY DATA:  On admission WBCs 6.4, H&H 14.1 and 41.2, platelet count  256 K.  He had 74 segs, 0 bands.  WBC today is 5.7.  Sodium is 140,  potassium 3.3, chloride 111, CO2 23, glucose 110, BUN 12, creatinine 1.1.  Serum potassium today is 2.8.  Bilirubin on admission 0.6, AP 72, AST 38,  ALT 30.  Total protein 6.0.  Albumin 3.8.  Calcium 8.4.  His amylase was 77,  lipase 40.   He had abdominal CT today which showed mild diverticulosis, bilateral kidney  stones without evidence of hydronephrosis.  Pancreas looks normal.  The bile  duct is not dilated.  He does not have any free fluid in his abdomen.   ASSESSMENT:  Acute illness - most likely on the basis of acute  gastroenteritis which could be food bolus, viral infection or perhaps  related to alcoholic gastritis.  I feel this syncope would appear to be due  to vasovagal reflux.  His AST is mildly elevated.  I feel this may be  indicative of mild or subacute hepatitis on the basis of excessive alcohol  use.  He could also have a biliary colic but I would expect to see numbers  go up, given excruciating pain that he had.  The patient had what appears to  be very violent significant heaving and I hope that he has not undone his  Nissen's route.   RECOMMENDATIONS:  1. We will repeat a CBC with diff in the a.m. along with LFTs.  I have     suggested to the patient that he should try to keep his alcohol intake to     no more than two drinks per day.  2. Patient will be re-evaluated in the a.m.   I would like  to thank Dr. Sherwood Gambler for the opportunity to participate in the  care of this gentleman.                                                Lionel December, M.D.   NR/MEDQ  D:  09/17/2002  T:  09/17/2002  Job:  [id number]

## 2010-08-02 NOTE — Op Note (Signed)
NAME:  Jordan Roth, Jordan Roth                         ACCOUNT NO.:  [id number]   MEDICAL RECORD NO.:  [id number]                   PATIENT TYPE:  AMB   LOCATION:  DAY                                  FACILITY:  [facility]   PHYSICIAN:  Lionel December, M.D.                 DATE OF BIRTH:  December 25, 1955   DATE OF PROCEDURE:  02/15/2002  DATE OF DISCHARGE:                                 OPERATIVE REPORT   PROCEDURE:  Esophagogastroduodenoscopy.   ENDOSCOPIST:  Lionel December, M.D.   INDICATIONS:  This patient is a 55 year old Caucasian male who has a history  of GERD complicated by Barrett's esophagus.  He had laparoscopic Nissen in  January 1999 and symptoms were well controlled.  His last surveillance exam  was in January 2000.  While he has not experienced any heartburn or  regurgitation he complains of dysphagia and he points to his throat.  He  feels that this is due to a problem and he has seen an ENT specialist and  nasal spray, etc., has not helped him.  He does not feel that this dysphagia  is esophageal.  He is undergoing an EGD with biopsy from his distal  esophagus or Barrett's segment.  The procedure and risks were reviewed with  the patient and informed consent was obtained.   PREOPERATIVE MEDICATIONS:  Cetacaine spray for pharyngeal topical  anesthesia, Demerol 50 mg IV and Versed 6 mg IV in divided dose.   INSTRUMENT:  Olympus video system.   FINDINGS:  Procedure performed in endoscopy suite.  The patient's vital  signs and O2 saturation were monitored during the procedure and remained  stable.  The patient was placed in the left lateral recumbent position and  endoscope was passed via the oropharynx without any difficulty into the  esophagus.   ESOPHAGUS:  Mucosa of the proximal and middle third was normal.  He had 2  islands of gastric type mucosa.  He had short segment of Barrett's esophagus  with very serrated margins.  The junction between the squamous and Barrett's  segment is at 38 cm.  There were no ulcers or a mass.  Multiple pictures  were taken for the record and biopsies were taken on the way out.   STOMACH:  It was empty and distended very well with insufflation.  The folds  of the proximal stomach were normal.  Examination of the mucosa, body,  antrum, pyloric channel, as well as angularis was normal.  Wrap was viewed  on the reflex view.  It was a fairly long wrap.   DUODENUM:  Examination of the bulb and second part of the duodenum was  normal.   As noted above multiple biopsies were taken from the Barrett's mucosa and  submitted in 1 container.  Esophagus was reexamined on the way out and he  had 3 very small islands of ectopic gastric mucosa in the cervical esophagus  but there was no ring or stricture.   The endoscope was withdrawn.  The patient tolerated the procedure well.   FINAL DIAGNOSES:  1. Short segment Barrett's esophagus which appears to be stable.  Comparison     with pictures from last exam as soon as they are available.  2. Fairly long intact, fundal wraps.  3. No abnormality noted in the cervical esophagus to account for his     dysphagia.   RECOMMENDATIONS:  1. I will contact the patient with biopsy results. If biopsies are negative     for dysplasia may consider repeat exam in 2-4 years.  2. If he remains with dysphagia, I would recommend barium study with the     barium pill.                                               Lionel December, M.D.    NR/MEDQ  D:  02/15/2002  T:  02/15/2002  Job:  [id number]   cc:   Kirk Ruths, M.D.  [address]  [place]  [place] [address]  Fax: [phone]

## 2014-10-05 ENCOUNTER — Encounter (INDEPENDENT_AMBULATORY_CARE_PROVIDER_SITE_OTHER): Payer: Self-pay

## 2014-10-05 ENCOUNTER — Encounter (INDEPENDENT_AMBULATORY_CARE_PROVIDER_SITE_OTHER): Payer: Self-pay | Admitting: *Deleted

## 2015-03-18 DIAGNOSIS — I209 Angina pectoris, unspecified: Secondary | ICD-10-CM

## 2015-03-18 HISTORY — DX: Angina pectoris, unspecified: I20.9

## 2015-07-20 ENCOUNTER — Other Ambulatory Visit (HOSPITAL_COMMUNITY): Payer: Self-pay | Admitting: Internal Medicine

## 2015-07-20 ENCOUNTER — Ambulatory Visit (HOSPITAL_COMMUNITY)
Admission: RE | Admit: 2015-07-20 | Discharge: 2015-07-20 | Disposition: A | Discharge status: Home / Self Care | Payer: 59 | Source: Ambulatory Visit | Attending: Internal Medicine | Admitting: Internal Medicine

## 2015-07-20 DIAGNOSIS — M25561 Pain in right knee: Secondary | ICD-10-CM | POA: Insufficient documentation

## 2015-07-20 DIAGNOSIS — M25461 Effusion, right knee: Secondary | ICD-10-CM | POA: Insufficient documentation

## 2015-12-20 DIAGNOSIS — E785 Hyperlipidemia, unspecified: Secondary | ICD-10-CM | POA: Insufficient documentation

## 2015-12-20 DIAGNOSIS — J449 Chronic obstructive pulmonary disease, unspecified: Secondary | ICD-10-CM

## 2015-12-20 DIAGNOSIS — G4730 Sleep apnea, unspecified: Secondary | ICD-10-CM | POA: Insufficient documentation

## 2015-12-20 DIAGNOSIS — I209 Angina pectoris, unspecified: Secondary | ICD-10-CM

## 2015-12-20 DIAGNOSIS — M109 Gout, unspecified: Secondary | ICD-10-CM | POA: Insufficient documentation

## 2015-12-21 ENCOUNTER — Encounter: Payer: Self-pay | Admitting: Cardiology

## 2015-12-21 ENCOUNTER — Ambulatory Visit (INDEPENDENT_AMBULATORY_CARE_PROVIDER_SITE_OTHER): Payer: 59 | Admitting: Cardiology

## 2015-12-21 VITALS — BP 112/62 | HR 72 | Ht 71.0 in | Wt 184.0 lb

## 2015-12-21 DIAGNOSIS — R0609 Other forms of dyspnea: Secondary | ICD-10-CM

## 2015-12-21 DIAGNOSIS — R0789 Other chest pain: Secondary | ICD-10-CM | POA: Diagnosis not present

## 2015-12-21 NOTE — Patient Instructions (Signed)
Medication Instructions:  Your physician recommends that you continue on your current medications as directed. Please refer to the Current Medication list given to you today.   Labwork: WE WILL REQUEST LABS FROM PCP  Testing/Procedures: Your physician has requested that you have an echocardiogram. Echocardiography is a painless test that uses sound waves to create images of your heart. It provides your doctor with information about the size and shape of your heart and how well your heart's chambers and valves are working. This procedure takes approximately one hour. There are no restrictions for this procedure.    Follow-Up: Your physician recommends that you schedule a follow-up appointment in: TO BE DETERMINED BASED ON TEST RESULTS   Any Other Special Instructions Will Be Listed Below (If Applicable).     If you need a refill on your cardiac medications before your next appointment, please call your pharmacy.

## 2015-12-21 NOTE — Progress Notes (Signed)
Clinical Summary Jordan Roth is a 60 y.o.male seen today as a new patient.He is referred by Dr Nevada Crane.   1. DOE - episode of DOE with lower levels of exertion. 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 Can have occasoinal episodes of palpitations, sharp stinging chest pain at times.  - no recent LE edema. No orthopnea or PND.  - chronic right knee pain, being considered for knee surgery. CAD risk factors: HL, remote tobacco history x 5, father MI 35s, paternal grandfather MI early 80s.    2. OSA - still using CPAP  3. Preoperative evaluation - he is being considered for knee surgery, seeking cardiac clearance.     Past Medical History:  Diagnosis Date  . Angina pectoris ([place])   . COPD (chronic obstructive pulmonary disease) ([place])   . Gout   . Hyperlipidemia   . Osteoarthrosis   . Sleep apnea      Allergies no known allergies   Current Outpatient Prescriptions  Medication Sig Dispense Refill  . aspirin EC 81 MG tablet Take 81 mg by mouth daily.    Marland Kitchen atorvastatin (LIPITOR) 10 MG tablet Take 10 mg by mouth daily.    . Omega-3 Fatty Acids (FISH OIL) 1000 MG CAPS Take 1,000 mg by mouth daily.     No current facility-administered medications for this visit.      Past Surgical History:  Procedure Laterality Date  . COLONOSCOPY       Allergies no known allergies    Family History  Problem Relation Age of Onset  . Family history unknown: Yes     Social History Jordan Roth reports that he has quit smoking. He has never used smokeless tobacco. Jordan Roth reports that he does not drink alcohol.   Review of Systems CONSTITUTIONAL: No weight loss, fever, chills, weakness or fatigue.  HEENT: Eyes: No visual loss, blurred vision, double vision or yellow sclerae.No hearing loss, sneezing, congestion, runny nose or sore throat.  SKIN: No rash or itching.  CARDIOVASCULAR: per HPI RESPIRATORY: per hPI GASTROINTESTINAL: No anorexia, nausea, vomiting or  diarrhea. No abdominal pain or blood.  GENITOURINARY: No burning on urination, no polyuria NEUROLOGICAL: No headache, dizziness, syncope, paralysis, ataxia, numbness or tingling in the extremities. No change in bowel or bladder control.  MUSCULOSKELETAL:chronic knee pain  LYMPHATICS: No enlarged nodes. No history of splenectomy.  PSYCHIATRIC: No history of depression or anxiety.  ENDOCRINOLOGIC: No reports of sweating, cold or heat intolerance. No polyuria or polydipsia.  Marland Kitchen   Physical Examination Vitals:   12/21/15 1329  BP: 112/62  Pulse: 72   Vitals:   12/21/15 1329  Weight: 184 lb (83.5 kg)  Height: 5\' 11"  (1.803 m)    Gen: resting comfortably, no acute distress HEENT: no scleral icterus, pupils equal round and reactive, no palptable cervical adenopathy,  CV: RRR, no m/r/g, no jvd Resp: Clear to auscultation bilaterally GI: abdomen is soft, non-tender, non-distended, normal bowel sounds, no hepatosplenomegaly MSK: extremities are warm, no edema.  Skin: warm, no rash Neuro:  no focal deficits Psych: appropriate affect      Assessment and Plan  1. DOE - unclear etiology. We will obtain echo to evaluate for possible structural heart disease as the cause - pending echo results, consider GXT to evaluate for ischemic heart disease and quantify symptoms of DOE.  - pending cardiac workup, consider cardiac clearance for possible knee surgery - baseline EKG form pcp's office reviewed, sinus brady 55 without ischemic changes.  F/u pending echo results. After echo likely obtain GXT        Arnoldo Lenis, M.D.,

## 2016-01-01 ENCOUNTER — Ambulatory Visit (HOSPITAL_COMMUNITY)
Admission: RE | Admit: 2016-01-01 | Discharge: 2016-01-01 | Disposition: A | Discharge status: Home / Self Care | Payer: 59 | Source: Ambulatory Visit | Attending: Cardiology | Admitting: Cardiology

## 2016-01-01 DIAGNOSIS — I358 Other nonrheumatic aortic valve disorders: Secondary | ICD-10-CM | POA: Insufficient documentation

## 2016-01-01 DIAGNOSIS — I348 Other nonrheumatic mitral valve disorders: Secondary | ICD-10-CM | POA: Diagnosis not present

## 2016-01-01 DIAGNOSIS — R0609 Other forms of dyspnea: Secondary | ICD-10-CM | POA: Diagnosis not present

## 2016-01-01 LAB — ECHOCARDIOGRAM COMPLETE
CHL CUP DOP CALC LVOT VTI: 23.3 cm
CHL CUP MV DEC (S): 148
CHL CUP STROKE VOLUME: 39 mL
E/e' ratio: 5.83
EWDT: 148 ms
FS: 35 % (ref 28–44)
IV/PV OW: 1.01
LA diam index: 1.75 cm/m2
LA vol A4C: 31.3 ml
LA vol: 44.2 mL
LASIZE: 36 mm
LAVOLIN: 21.5 mL/m2
LDCA: 3.14 cm2
LEFT ATRIUM END SYS DIAM: 36 mm
LV E/e' medial: 5.83
LV TDI E'MEDIAL: 8.92
LV dias vol index: 31 mL/m2
LV sys vol index: 12 mL/m2
LV sys vol: 25 mL (ref 21–61)
LVDIAVOL: 65 mL (ref 62–150)
LVEEAVG: 5.83
LVELAT: 12 cm/s
LVOT diameter: 20 mm
LVOT peak grad rest: 4 mmHg
LVOTPV: 94 cm/s
LVOTSV: 73 mL
Lateral S' vel: 12.3 cm/s
MV pk A vel: 49.7 m/s
MVPKEVEL: 69.9 m/s
PW: 10 mm — AB (ref 0.6–1.1)
Simpson's disk: 61
TAPSE: 20.8 mm
TDI e' lateral: 12

## 2016-01-01 NOTE — Progress Notes (Signed)
*  PRELIMINARY RESULTS* Echocardiogram 2D Echocardiogram has been performed.  Jordan Roth 01/01/2016, 9:25 AM

## 2016-01-03 ENCOUNTER — Telehealth: Payer: Self-pay

## 2016-01-03 ENCOUNTER — Other Ambulatory Visit: Payer: Self-pay

## 2016-01-03 DIAGNOSIS — R0609 Other forms of dyspnea: Principal | ICD-10-CM

## 2016-01-03 NOTE — Progress Notes (Unsigned)
Put in order for lexi. Will notify pt.

## 2016-01-03 NOTE — Telephone Encounter (Signed)
-----   Message from Massie Maroon, [place] sent at 01/03/2016  1:11 PM EDT -----   ----- Message ----- From: Arnoldo Lenis, MD Sent: 01/03/2016  12:34 PM To: Staci T Ashworth, CMA  Echo overall looks good. Nothing to explain his symptoms. I would like to obtain a GXT for DOE if he is able to run on the treadmill. Does not need to hold any meds  Zandra Abts MD

## 2016-01-03 NOTE — Telephone Encounter (Signed)
Called pt, spoke with pt's wife and they agreed he was not able to go run or even walk fast on a treadmill. He did ask if it was another test that he could do. Please advise.

## 2016-01-03 NOTE — Telephone Encounter (Signed)
Please order a lexiscan, does not need to hold any meds   Zandra Abts MD

## 2016-01-14 ENCOUNTER — Encounter (HOSPITAL_COMMUNITY)
Admission: RE | Admit: 2016-01-14 | Discharge: 2016-01-14 | Disposition: A | Discharge status: Home / Self Care | Payer: 59 | Source: Ambulatory Visit | Attending: Cardiology | Admitting: Cardiology

## 2016-01-14 ENCOUNTER — Encounter (HOSPITAL_COMMUNITY): Admission: RE | Admit: 2016-01-14 | Payer: 59 | Source: Ambulatory Visit

## 2016-01-14 ENCOUNTER — Inpatient Hospital Stay (HOSPITAL_COMMUNITY): Admission: RE | Admit: 2016-01-14 | Payer: 59 | Source: Ambulatory Visit

## 2016-01-14 DIAGNOSIS — I259 Chronic ischemic heart disease, unspecified: Secondary | ICD-10-CM | POA: Insufficient documentation

## 2016-01-14 DIAGNOSIS — R0609 Other forms of dyspnea: Secondary | ICD-10-CM | POA: Insufficient documentation

## 2016-01-17 ENCOUNTER — Encounter (HOSPITAL_COMMUNITY)
Admission: RE | Admit: 2016-01-17 | Discharge: 2016-01-17 | Disposition: A | Discharge status: Home / Self Care | Payer: 59 | Source: Ambulatory Visit | Attending: Cardiology | Admitting: Cardiology

## 2016-01-17 ENCOUNTER — Inpatient Hospital Stay (HOSPITAL_COMMUNITY): Admission: RE | Admit: 2016-01-17 | Payer: 59 | Source: Ambulatory Visit

## 2016-01-17 ENCOUNTER — Encounter (HOSPITAL_COMMUNITY): Payer: Self-pay

## 2016-01-17 DIAGNOSIS — I259 Chronic ischemic heart disease, unspecified: Secondary | ICD-10-CM | POA: Diagnosis not present

## 2016-01-17 DIAGNOSIS — R0609 Other forms of dyspnea: Secondary | ICD-10-CM | POA: Insufficient documentation

## 2016-01-17 DIAGNOSIS — R9439 Abnormal result of other cardiovascular function study: Secondary | ICD-10-CM | POA: Insufficient documentation

## 2016-01-17 HISTORY — DX: Malignant (primary) neoplasm, unspecified: C80.1

## 2016-01-17 LAB — NM MYOCAR MULTI W/SPECT W/WALL MOTION / EF
CHL CUP NUCLEAR SSS: 12
CSEPPHR: 88 {beats}/min
LV sys vol: 40 mL
LVDIAVOL: 93 mL (ref 62–150)
NUC STRESS TID: 1.1
RATE: 0.28
Rest HR: 50 {beats}/min
SDS: 5
SRS: 7

## 2016-01-17 MED ORDER — TECHNETIUM TC 99M TETROFOSMIN IV KIT
10.0000 | PACK | Freq: Once | INTRAVENOUS | Status: AC | PRN
  Administered 2016-01-17: 11 via INTRAVENOUS

## 2016-01-17 MED ORDER — REGADENOSON 0.4 MG/5ML IV SOLN
INTRAVENOUS | Status: AC
  Administered 2016-01-17: 0.4 mg via INTRAVENOUS
  Filled 2016-01-17: qty 5

## 2016-01-17 MED ORDER — SODIUM CHLORIDE 0.9% FLUSH
INTRAVENOUS | Status: AC
  Filled 2016-01-17: qty 240

## 2016-01-17 MED ORDER — TECHNETIUM TC 99M TETROFOSMIN IV KIT
30.0000 | PACK | Freq: Once | INTRAVENOUS | Status: AC | PRN
  Administered 2016-01-17: 31.4 via INTRAVENOUS

## 2016-01-17 MED ORDER — SODIUM CHLORIDE 0.9% FLUSH
INTRAVENOUS | Status: AC
Start: 2016-01-17 — End: 2016-01-17
  Administered 2016-01-17: 10 mL via INTRAVENOUS
  Filled 2016-01-17: qty 10

## 2016-01-18 ENCOUNTER — Telehealth: Payer: Self-pay

## 2016-01-18 NOTE — Telephone Encounter (Signed)
Called pt, no answer- left message for pt to return call. Need to know when the best day for him to have heart cath scheduled- per abnormal stress test.

## 2016-01-18 NOTE — Telephone Encounter (Signed)
-----   Message from Arnoldo Lenis, MD sent at 01/18/2016  3:51 PM EDT ----- Stress test is abnormal, suggests he may have some blockages that are the cause of his symptoms. He needs a heart cath at [facility], any provider.    Zandra Abts MD

## 2016-01-21 ENCOUNTER — Telehealth: Payer: Self-pay

## 2016-01-21 NOTE — Telephone Encounter (Signed)
-----   Message from Arnoldo Lenis, MD sent at 01/18/2016  3:51 PM EDT ----- Stress test is abnormal, suggests he may have some blockages that are the cause of his symptoms. He needs a heart cath at [facility], any provider.    Zandra Abts MD

## 2016-01-21 NOTE — Telephone Encounter (Signed)
No answer, left voicemail for pt to return call.

## 2016-01-29 ENCOUNTER — Telehealth: Payer: Self-pay

## 2016-01-29 NOTE — Telephone Encounter (Signed)
Spoke with pt's wife and he is scheduled for a Heart Cath on 11/16 @ [facility].

## 2016-01-29 NOTE — Telephone Encounter (Signed)
-----   Message from Arnoldo Lenis, MD sent at 01/21/2016  2:47 PM EST ----- Regarding: RE: Heart Cath I recommend sooner rather than later. Stress suggests there may some blockages, if they get worst he could potentially have a heart attack.  [place] ----- Message ----- From: Drema Dallas, CMA Sent: 01/21/2016  11:34 AM To: Arnoldo Lenis, MD Subject: Heart Cath                                     Finally spoke to pt., He wanted me to ask you if it was urgent to get the heart cath as he is working on a big job @ [employer] office and it has to be ready to be opened by 03/17/16. He states that he is under a contract and he needs to finish that job. He stated that he has had a small episode here n there, but nothing constant. Please advise .

## 2016-02-04 ENCOUNTER — Telehealth: Payer: Self-pay | Admitting: Cardiology

## 2016-04-09 DIAGNOSIS — E782 Mixed hyperlipidemia: Secondary | ICD-10-CM | POA: Diagnosis not present

## 2016-04-11 DIAGNOSIS — G4733 Obstructive sleep apnea (adult) (pediatric): Secondary | ICD-10-CM | POA: Diagnosis not present

## 2016-04-11 DIAGNOSIS — Z0001 Encounter for general adult medical examination with abnormal findings: Secondary | ICD-10-CM | POA: Diagnosis not present

## 2016-04-11 DIAGNOSIS — E782 Mixed hyperlipidemia: Secondary | ICD-10-CM | POA: Diagnosis not present

## 2016-10-07 DIAGNOSIS — Z0000 Encounter for general adult medical examination without abnormal findings: Secondary | ICD-10-CM | POA: Diagnosis not present

## 2016-10-07 DIAGNOSIS — Z1159 Encounter for screening for other viral diseases: Secondary | ICD-10-CM | POA: Diagnosis not present

## 2016-10-09 DIAGNOSIS — M1711 Unilateral primary osteoarthritis, right knee: Secondary | ICD-10-CM | POA: Diagnosis not present

## 2016-10-09 DIAGNOSIS — E782 Mixed hyperlipidemia: Secondary | ICD-10-CM | POA: Diagnosis not present

## 2016-10-09 DIAGNOSIS — R944 Abnormal results of kidney function studies: Secondary | ICD-10-CM | POA: Diagnosis not present

## 2016-12-05 DIAGNOSIS — H1131 Conjunctival hemorrhage, right eye: Secondary | ICD-10-CM | POA: Diagnosis not present

## 2016-12-05 DIAGNOSIS — Z7982 Long term (current) use of aspirin: Secondary | ICD-10-CM | POA: Diagnosis not present

## 2016-12-05 DIAGNOSIS — Z79899 Other long term (current) drug therapy: Secondary | ICD-10-CM | POA: Diagnosis not present

## 2017-04-07 DIAGNOSIS — R944 Abnormal results of kidney function studies: Secondary | ICD-10-CM | POA: Diagnosis not present

## 2017-04-07 DIAGNOSIS — R945 Abnormal results of liver function studies: Secondary | ICD-10-CM | POA: Diagnosis not present

## 2017-04-07 DIAGNOSIS — E782 Mixed hyperlipidemia: Secondary | ICD-10-CM | POA: Diagnosis not present

## 2017-04-07 DIAGNOSIS — Z23 Encounter for immunization: Secondary | ICD-10-CM | POA: Diagnosis not present

## 2017-04-10 DIAGNOSIS — J449 Chronic obstructive pulmonary disease, unspecified: Secondary | ICD-10-CM | POA: Diagnosis not present

## 2017-04-27 DIAGNOSIS — M5441 Lumbago with sciatica, right side: Secondary | ICD-10-CM | POA: Diagnosis not present

## 2017-04-27 DIAGNOSIS — S335XXA Sprain of ligaments of lumbar spine, initial encounter: Secondary | ICD-10-CM | POA: Diagnosis not present

## 2017-04-29 DIAGNOSIS — M5441 Lumbago with sciatica, right side: Secondary | ICD-10-CM | POA: Diagnosis not present

## 2017-04-29 DIAGNOSIS — S335XXA Sprain of ligaments of lumbar spine, initial encounter: Secondary | ICD-10-CM | POA: Diagnosis not present

## 2017-05-01 DIAGNOSIS — M5441 Lumbago with sciatica, right side: Secondary | ICD-10-CM | POA: Diagnosis not present

## 2017-05-01 DIAGNOSIS — S335XXA Sprain of ligaments of lumbar spine, initial encounter: Secondary | ICD-10-CM | POA: Diagnosis not present

## 2017-05-04 DIAGNOSIS — M5441 Lumbago with sciatica, right side: Secondary | ICD-10-CM | POA: Diagnosis not present

## 2017-05-04 DIAGNOSIS — S335XXA Sprain of ligaments of lumbar spine, initial encounter: Secondary | ICD-10-CM | POA: Diagnosis not present

## 2017-05-06 DIAGNOSIS — S335XXA Sprain of ligaments of lumbar spine, initial encounter: Secondary | ICD-10-CM | POA: Diagnosis not present

## 2017-05-06 DIAGNOSIS — M5441 Lumbago with sciatica, right side: Secondary | ICD-10-CM | POA: Diagnosis not present

## 2017-05-08 DIAGNOSIS — M5441 Lumbago with sciatica, right side: Secondary | ICD-10-CM | POA: Diagnosis not present

## 2017-05-08 DIAGNOSIS — S335XXA Sprain of ligaments of lumbar spine, initial encounter: Secondary | ICD-10-CM | POA: Diagnosis not present

## 2017-06-11 DIAGNOSIS — M1711 Unilateral primary osteoarthritis, right knee: Secondary | ICD-10-CM | POA: Diagnosis not present

## 2017-09-07 DIAGNOSIS — E782 Mixed hyperlipidemia: Secondary | ICD-10-CM | POA: Diagnosis not present

## 2017-09-11 DIAGNOSIS — E782 Mixed hyperlipidemia: Secondary | ICD-10-CM | POA: Diagnosis not present

## 2017-09-11 DIAGNOSIS — Z0000 Encounter for general adult medical examination without abnormal findings: Secondary | ICD-10-CM | POA: Diagnosis not present

## 2017-09-11 DIAGNOSIS — G4733 Obstructive sleep apnea (adult) (pediatric): Secondary | ICD-10-CM | POA: Diagnosis not present

## 2017-11-27 DIAGNOSIS — Z23 Encounter for immunization: Secondary | ICD-10-CM | POA: Diagnosis not present

## 2017-12-15 NOTE — H&P (Signed)
TOTAL KNEE ADMISSION H&P  Patient is being admitted for right total knee arthroplasty.  Subjective:  Chief Complaint:right knee pain.  HPI: Jordan Roth, 62 y.o. male, has a history of pain and functional disability in the right knee due to arthritis and has failed non-surgical conservative treatments for greater than 12 weeks to includecorticosteriod injections, viscosupplementation injections and activity modification.  Onset of symptoms was gradual, starting 3 years ago with gradually worsening course since that time. The patient noted no past surgery on the right knee(s).  Patient currently rates pain in the right knee(s) at 9 out of 10 with activity. Patient has worsening of pain with activity and weight bearing, crepitus, joint swelling and instability.  Patient has evidence of bone-on-bone arthritis in the medial compartment with many posterior and medial osteophytes by imaging studies. There is no active infection.  Patient Active Problem List   Diagnosis Date Noted  . Gout 12/20/2015  . COPD (chronic obstructive pulmonary disease) ([place]) 12/20/2015  . Hyperlipidemia 12/20/2015  . Sleep apnea 12/20/2015  . Angina pectoris ([place]) 12/20/2015   Past Medical History:  Diagnosis Date  . Angina pectoris ([place])   . Cancer [facility])    Pre-esophogeal?  . COPD (chronic obstructive pulmonary disease) ([place])   . Gout   . Hyperlipidemia   . Osteoarthrosis   . Sleep apnea     Past Surgical History:  Procedure Laterality Date  . COLONOSCOPY      No current facility-administered medications for this encounter.    Current Outpatient Medications  Medication Sig Dispense Refill Last Dose  . aspirin EC 81 MG tablet Take 81 mg by mouth daily.   Taking  . atorvastatin (LIPITOR) 10 MG tablet Take 10 mg by mouth daily.   Taking  . Omega-3 Fatty Acids (FISH OIL) 1000 MG CAPS Take 1,000 mg by mouth daily.   Taking   No Known Allergies  Social History   Tobacco Use  . Smoking status: Former  Research scientist (life sciences)  . Smokeless tobacco: Never Used  Substance Use Topics  . Alcohol use: No    Family History  Family history unknown: Yes     Review of Systems  Constitutional: Negative for chills and fever.  HENT: Negative for congestion, sore throat and tinnitus.   Eyes: Negative for double vision, photophobia and pain.  Respiratory: Negative for cough, shortness of breath and wheezing.   Cardiovascular: Negative for chest pain, palpitations and orthopnea.  Gastrointestinal: Negative for heartburn, nausea and vomiting.  Genitourinary: Negative for dysuria, frequency and urgency.  Musculoskeletal: Positive for joint pain.  Neurological: Negative for dizziness, weakness and headaches.    Objective:  Physical Exam  Well nourished and well developed. General: Alert and oriented x3, cooperative and pleasant, no acute distress. Head: normocephalic, atraumatic, neck supple. Eyes: EOMI. Respiratory: breath sounds clear in all fields, no wheezing, rales, or rhonchi. Cardiovascular: Regular rate and rhythm, no murmurs, gallops or rubs.  Abdomen: non-tender to palpation and soft, normoactive bowel sounds. Musculoskeletal: Antalgic gait without using assisted devices.  Left Hip Exam: ROM: normal without discomfort. There is no tenderness over the greater trochanter. There is no pain on provocative testing of the hip. Left Knee Exam: No effusion. Range of motion is 0-125 degrees. No crepitus on range of motion of the knee. No medial or lateral joint line tenderness. Stable knee. Right Hip Exam: ROM: normal without discomfort. There is no tenderness over the greater trochanter. There is no pain on provocative testing of the hip. Right Knee  Exam: Varus deformity. Slight effusion. Range of motion is 5-125 degrees. No crepitus on range of motion of the knee. Medial lateral joint line tenderness. No lateral joint line tenderness. Stable knee. Calves soft and nontender. Motor function intact in LE.  Strength 5/5 LE bilaterally. Neuro: Distal pulses 2+. Sensation to light touch intact in LE.  Vital signs in last 24 hours: Blood pressure: 126/78 mmHg Pulse: 82 bpm   Labs:   Estimated body mass index is 25.66 kg/m as calculated from the following:   Height as of 12/21/15: 5\' 11"  (1.803 m).   Weight as of 12/21/15: 83.5 kg.   Imaging Review Plain radiographs demonstrate severe degenerative joint disease of the right knee(s). The overall alignment issignificant varus. The bone quality appears to be adequate for age and reported activity level.   Preoperative templating of the joint replacement has been completed, documented, and submitted to the Operating Room personnel in order to optimize intra-operative equipment management.   Anticipated LOS equal to or greater than 2 midnights due to - Age 22 and older with one or more of the following:  - Obesity  - Expected need for hospital services (PT, OT, Nursing) required for safe  discharge  - Anticipated need for postoperative skilled nursing care or inpatient rehab  - Active co-morbidities: Respiratory Failure/COPD OR   - Unanticipated findings during/Post Surgery: None  - Patient is a high risk of re-admission due to: None     Assessment/Plan:  End stage arthritis, right knee   The patient history, physical examination, clinical judgment of the provider and imaging studies are consistent with end stage degenerative joint disease of the right knee(s) and total knee arthroplasty is deemed medically necessary. The treatment options including medical management, injection therapy arthroscopy and arthroplasty were discussed at length. The risks and benefits of total knee arthroplasty were presented and reviewed. The risks due to aseptic loosening, infection, stiffness, patella tracking problems, thromboembolic complications and other imponderables were discussed. The patient acknowledged the explanation, agreed to proceed with the  plan and consent was signed. Patient is being admitted for inpatient treatment for surgery, pain control, PT, OT, prophylactic antibiotics, VTE prophylaxis, progressive ambulation and ADL's and discharge planning. The patient is planning to be discharged home with outpatient physical therapy.   Therapy Plans: outpatient therapy at [facility] Disposition: Home with wife Planned DVT Prophylaxis: Aspirin 325 mg BID DME needed: walker, 3-n-1 PCP: Dr. Wende Neighbors TXA: IV Allergies: NKDA Anesthesia Concerns: None  - Patient was instructed on what medications to stop prior to surgery. - Follow-up visit in 2 weeks with Dr. Wynelle Link - Begin physical therapy following surgery - Pre-operative lab work as pre-surgical testing - Prescriptions will be provided in hospital at time of discharge  Theresa Duty, PA-C Orthopedic Surgery [facility] [place]

## 2017-12-16 DIAGNOSIS — D485 Neoplasm of uncertain behavior of skin: Secondary | ICD-10-CM | POA: Diagnosis not present

## 2017-12-16 DIAGNOSIS — L820 Inflamed seborrheic keratosis: Secondary | ICD-10-CM | POA: Diagnosis not present

## 2017-12-30 ENCOUNTER — Other Ambulatory Visit (HOSPITAL_COMMUNITY): Payer: Self-pay | Admitting: *Deleted

## 2017-12-30 NOTE — Patient Instructions (Addendum)
QUADRE BRISTOL  12/30/2017   Your procedure is scheduled on: 01-11-18  Report to [facility] Main  Entrance  Report to admitting at 530 AM   Bring cpap mask and tubing  Call this number if you have problems the morning of surgery [phone]   Remember: Do not eat food or drink liquids :After Midnight. BRUSH YOUR TEETH MORNING OF SURGERY AND RINSE YOUR MOUTH OUT, NO CHEWING GUM CANDY OR MINTS.     Take these medicines the morning of surgery with A SIP OF WATER: NONE             You may not have any metal on your body including hair pins and              piercings  Do not wear jewelry, make-up, lotions, powders or perfumes, deodorant             Do not wear nail polish.  Do not shave  48 hours prior to surgery.              Men may shave face and neck.   Do not bring valuables to the hospital. [place].  Contacts, dentures or bridgework may not be worn into surgery.  Leave suitcase in the car. After surgery it may be brought to your room.                  Please read over the following fact sheets you were given: _____________________________________________________________________             [facility] - Preparing for Surgery Before surgery, you can play an important role.  Because skin is not sterile, your skin needs to be as free of germs as possible.  You can reduce the number of germs on your skin by washing with CHG (chlorahexidine gluconate) soap before surgery.  CHG is an antiseptic cleaner which kills germs and bonds with the skin to continue killing germs even after washing. Please DO NOT use if you have an allergy to CHG or antibacterial soaps.  If your skin becomes reddened/irritated stop using the CHG and inform your nurse when you arrive at Short Stay. Do not shave (including legs and underarms) for at least 48 hours prior to the first CHG shower.  You may shave your face/neck. Please  follow these instructions carefully:  1.  Shower with CHG Soap the night before surgery and the  morning of Surgery.  2.  If you choose to wash your hair, wash your hair first as usual with your  normal  shampoo.  3.  After you shampoo, rinse your hair and body thoroughly to remove the  shampoo.                           4.  Use CHG as you would any other liquid soap.  You can apply chg directly  to the skin and wash                       Gently with a scrungie or clean washcloth.  5.  Apply the CHG Soap to your body ONLY FROM THE NECK DOWN.   Do not use on face/ open  Wound or open sores. Avoid contact with eyes, ears mouth and genitals (private parts).                       Wash face,  Genitals (private parts) with your normal soap.             6.  Wash thoroughly, paying special attention to the area where your surgery  will be performed.  7.  Thoroughly rinse your body with warm water from the neck down.  8.  DO NOT shower/wash with your normal soap after using and rinsing off  the CHG Soap.                9.  Pat yourself dry with a clean towel.            10.  Wear clean pajamas.            11.  Place clean sheets on your bed the night of your first shower and do not  sleep with pets. Day of Surgery : Do not apply any lotions/deodorants the morning of surgery.  Please wear clean clothes to the hospital/surgery center.  FAILURE TO FOLLOW THESE INSTRUCTIONS MAY RESULT IN THE CANCELLATION OF YOUR SURGERY PATIENT SIGNATURE_________________________________  NURSE SIGNATURE__________________________________  ________________________________________________________________________   Adam Phenix  An incentive spirometer is a tool that can help keep your lungs clear and active. This tool measures how well you are filling your lungs with each breath. Taking long deep breaths may help reverse or decrease the chance of developing breathing (pulmonary) problems  (especially infection) following:  A long period of time when you are unable to move or be active. BEFORE THE PROCEDURE   If the spirometer includes an indicator to show your best effort, your nurse or respiratory therapist will set it to a desired goal.  If possible, sit up straight or lean slightly forward. Try not to slouch.  Hold the incentive spirometer in an upright position. INSTRUCTIONS FOR USE  1. Sit on the edge of your bed if possible, or sit up as far as you can in bed or on a chair. 2. Hold the incentive spirometer in an upright position. 3. Breathe out normally. 4. Place the mouthpiece in your mouth and seal your lips tightly around it. 5. Breathe in slowly and as deeply as possible, raising the piston or the ball toward the top of the column. 6. Hold your breath for 3-5 seconds or for as long as possible. Allow the piston or ball to fall to the bottom of the column. 7. Remove the mouthpiece from your mouth and breathe out normally. 8. Rest for a few seconds and repeat Steps 1 through 7 at least 10 times every 1-2 hours when you are awake. Take your time and take a few normal breaths between deep breaths. 9. The spirometer may include an indicator to show your best effort. Use the indicator as a goal to work toward during each repetition. 10. After each set of 10 deep breaths, practice coughing to be sure your lungs are clear. If you have an incision (the cut made at the time of surgery), support your incision when coughing by placing a pillow or rolled up towels firmly against it. Once you are able to get out of bed, walk around indoors and cough well. You may stop using the incentive spirometer when instructed by your caregiver.  RISKS AND COMPLICATIONS  Take your time so you do not get  dizzy or light-headed.  If you are in pain, you may need to take or ask for pain medication before doing incentive spirometry. It is harder to take a deep breath if you are having  pain. AFTER USE  Rest and breathe slowly and easily.  It can be helpful to keep track of a log of your progress. Your caregiver can provide you with a simple table to help with this. If you are using the spirometer at home, follow these instructions: [place] IF:   You are having difficultly using the spirometer.  You have trouble using the spirometer as often as instructed.  Your pain medication is not giving enough relief while using the spirometer.  You develop fever of 100.5 F (38.1 C) or higher. SEEK IMMEDIATE MEDICAL CARE IF:   You cough up bloody sputum that had not been present before.  You develop fever of 102 F (38.9 C) or greater.  You develop worsening pain at or near the incision site. MAKE SURE YOU:   Understand these instructions.  Will watch your condition.  Will get help right away if you are not doing well or get worse. Document Released: 07/14/2006 Document Revised: 05/26/2011 Document Reviewed: 09/14/2006 ExitCare Patient Information 2014 Exit[employer], [place].   ________________________________________________________________________  WHAT IS A BLOOD TRANSFUSION? Blood Transfusion Information  A transfusion is the replacement of blood or some of its parts. Blood is made up of multiple cells which provide different functions.  Red blood cells carry oxygen and are used for blood loss replacement.  White blood cells fight against infection.  Platelets control bleeding.  Plasma helps clot blood.  Other blood products are available for specialized needs, such as hemophilia or other clotting disorders. BEFORE THE TRANSFUSION  Who gives blood for transfusions?   Healthy volunteers who are fully evaluated to make sure their blood is safe. This is blood bank blood. Transfusion therapy is the safest it has ever been in the practice of medicine. Before blood is taken from a donor, a complete history is taken to make sure that person has no history  of diseases nor engages in risky social behavior (examples are intravenous drug use or sexual activity with multiple partners). The donor's travel history is screened to minimize risk of transmitting infections, such as malaria. The donated blood is tested for signs of infectious diseases, such as HIV and hepatitis. The blood is then tested to be sure it is compatible with you in order to minimize the chance of a transfusion reaction. If you or a relative donates blood, this is often done in anticipation of surgery and is not appropriate for emergency situations. It takes many days to process the donated blood. RISKS AND COMPLICATIONS Although transfusion therapy is very safe and saves many lives, the main dangers of transfusion include:   Getting an infectious disease.  Developing a transfusion reaction. This is an allergic reaction to something in the blood you were given. Every precaution is taken to prevent this. The decision to have a blood transfusion has been considered carefully by your caregiver before blood is given. Blood is not given unless the benefits outweigh the risks. AFTER THE TRANSFUSION  Right after receiving a blood transfusion, you will usually feel much better and more energetic. This is especially true if your red blood cells have gotten low (anemic). The transfusion raises the level of the red blood cells which carry oxygen, and this usually causes an energy increase.  The nurse administering the transfusion will  monitor you carefully for complications. HOME CARE INSTRUCTIONS  No special instructions are needed after a transfusion. You may find your energy is better. Speak with your caregiver about any limitations on activity for underlying diseases you may have. SEEK MEDICAL CARE IF:   Your condition is not improving after your transfusion.  You develop redness or irritation at the intravenous (IV) site. SEEK IMMEDIATE MEDICAL CARE IF:  Any of the following symptoms  occur over the next 12 hours:  Shaking chills.  You have a temperature by mouth above 102 F (38.9 C), not controlled by medicine.  Chest, back, or muscle pain.  People around you feel you are not acting correctly or are confused.  Shortness of breath or difficulty breathing.  Dizziness and fainting.  You get a rash or develop hives.  You have a decrease in urine output.  Your urine turns a dark color or changes to pink, red, or brown. Any of the following symptoms occur over the next 10 days:  You have a temperature by mouth above 102 F (38.9 C), not controlled by medicine.  Shortness of breath.  Weakness after normal activity.  The white part of the eye turns yellow (jaundice).  You have a decrease in the amount of urine or are urinating less often.  Your urine turns a dark color or changes to pink, red, or brown. Document Released: 02/29/2000 Document Revised: 05/26/2011 Document Reviewed: 10/18/2007 [facility] Patient Information 2014 [place], [place].  _______________________________________________________________________

## 2018-01-06 ENCOUNTER — Encounter (HOSPITAL_COMMUNITY)
Admission: RE | Admit: 2018-01-06 | Discharge: 2018-01-06 | Disposition: A | Discharge status: Home / Self Care | Payer: 59 | Source: Ambulatory Visit | Attending: Orthopedic Surgery | Admitting: Orthopedic Surgery

## 2018-01-06 ENCOUNTER — Other Ambulatory Visit: Payer: Self-pay

## 2018-01-06 ENCOUNTER — Encounter (HOSPITAL_COMMUNITY): Payer: Self-pay

## 2018-01-06 DIAGNOSIS — R001 Bradycardia, unspecified: Secondary | ICD-10-CM | POA: Insufficient documentation

## 2018-01-06 DIAGNOSIS — Z01818 Encounter for other preprocedural examination: Secondary | ICD-10-CM | POA: Insufficient documentation

## 2018-01-06 DIAGNOSIS — Z8679 Personal history of other diseases of the circulatory system: Secondary | ICD-10-CM | POA: Diagnosis not present

## 2018-01-06 DIAGNOSIS — M1711 Unilateral primary osteoarthritis, right knee: Secondary | ICD-10-CM | POA: Insufficient documentation

## 2018-01-06 HISTORY — DX: Raynaud's syndrome without gangrene: I73.00

## 2018-01-06 HISTORY — DX: Personal history of urinary calculi: Z87.442

## 2018-01-06 HISTORY — DX: Cramp and spasm: R25.2

## 2018-01-06 LAB — CBC
HCT: 49.4 % (ref 39.0–52.0)
HEMOGLOBIN: 16.5 g/dL (ref 13.0–17.0)
MCH: 30.3 pg (ref 26.0–34.0)
MCHC: 33.4 g/dL (ref 30.0–36.0)
MCV: 90.6 fL (ref 80.0–100.0)
NRBC: 0 % (ref 0.0–0.2)
Platelets: 278 10*3/uL (ref 150–400)
RBC: 5.45 MIL/uL (ref 4.22–5.81)
RDW: 13.3 % (ref 11.5–15.5)
WBC: 4.7 10*3/uL (ref 4.0–10.5)

## 2018-01-06 LAB — COMPREHENSIVE METABOLIC PANEL
ALBUMIN: 4.2 g/dL (ref 3.5–5.0)
ALK PHOS: 70 U/L (ref 38–126)
ALT: 34 U/L (ref 0–44)
ANION GAP: 6 (ref 5–15)
AST: 25 U/L (ref 15–41)
BUN: 15 mg/dL (ref 8–23)
CO2: 27 mmol/L (ref 22–32)
Calcium: 9.1 mg/dL (ref 8.9–10.3)
Chloride: 105 mmol/L (ref 98–111)
Creatinine, Ser: 1.19 mg/dL (ref 0.61–1.24)
GFR calc Af Amer: >60 mL/min (ref >60)
GFR calc non Af Amer: >60 mL/min (ref >60)
GLUCOSE: 100 mg/dL — AB (ref 70–99)
POTASSIUM: 4.9 mmol/L (ref 3.5–5.1)
SODIUM: 138 mmol/L (ref 135–145)
Total Bilirubin: 1.1 mg/dL (ref 0.3–1.2)
Total Protein: 7.1 g/dL (ref 6.5–8.1)

## 2018-01-06 LAB — PROTIME-INR
INR: 0.9
Prothrombin Time: 12.1 seconds (ref 11.4–15.2)

## 2018-01-06 LAB — ABO/RH: ABO/RH(D): B POS

## 2018-01-06 LAB — APTT: APTT: 32 s (ref 24–36)

## 2018-01-06 LAB — SURGICAL PCR SCREEN
MRSA, PCR: NEGATIVE
Staphylococcus aureus: NEGATIVE

## 2018-01-10 MED ORDER — BUPIVACAINE LIPOSOME 1.3 % IJ SUSP
20.0000 mL | Freq: Once | INTRAMUSCULAR | Status: DC
  Filled 2018-01-10: qty 20

## 2018-01-10 NOTE — Anesthesia Preprocedure Evaluation (Signed)
Anesthesia Evaluation  Patient identified by MRN, date of birth, ID band Patient awake    Reviewed: Allergy & Precautions, NPO status , Patient's Chart, lab work & pertinent test results  Airway Mallampati: II  TM Distance: >3 FB Neck ROM: Full    Dental no notable dental hx.    Pulmonary sleep apnea , COPD, former smoker,    Pulmonary exam normal breath sounds clear to auscultation       Cardiovascular + angina Normal cardiovascular exam Rhythm:Regular Rate:Normal  Cath 01/2016  Insignificant coronary artery disease No cardiac abnormality to explain SOB, or abnormal stress nuclear test Continue medical therapy     Neuro/Psych negative neurological ROS  negative psych ROS   GI/Hepatic negative GI ROS, Neg liver ROS,   Endo/Other  negative endocrine ROS  Renal/GU negative Renal ROS     Musculoskeletal  (+) Arthritis ,   Abdominal   Peds  Hematology negative hematology ROS (+)   Anesthesia Other Findings   Reproductive/Obstetrics                             Anesthesia Physical Anesthesia Plan  ASA: III  Anesthesia Plan: Spinal   Post-op Pain Management:  Regional for Post-op pain   Induction: Intravenous  PONV Risk Score and Plan: Ondansetron, Dexamethasone, Propofol infusion and Treatment may vary due to age or medical condition  Airway Management Planned:   Additional Equipment: None  Intra-op Plan:   Post-operative Plan:   Informed Consent: I have reviewed the patients History and Physical, chart, labs and discussed the procedure including the risks, benefits and alternatives for the proposed anesthesia with the patient or authorized representative who has indicated his/her understanding and acceptance.   Dental advisory given  Plan Discussed with: CRNA  Anesthesia Plan Comments:         Anesthesia Quick Evaluation

## 2018-01-11 ENCOUNTER — Ambulatory Visit (HOSPITAL_COMMUNITY): Payer: 59 | Admitting: Certified Registered Nurse Anesthetist

## 2018-01-11 ENCOUNTER — Ambulatory Visit (HOSPITAL_COMMUNITY)
Admission: AD | Admit: 2018-01-11 | Discharge: 2018-01-13 | Disposition: A | Discharge status: Home / Self Care | Payer: 59 | Source: Ambulatory Visit | Attending: Orthopedic Surgery | Admitting: Orthopedic Surgery

## 2018-01-11 ENCOUNTER — Encounter (HOSPITAL_COMMUNITY)
Admission: AD | Disposition: A | Discharge status: Home / Self Care | Payer: Self-pay | Source: Ambulatory Visit | Attending: Orthopedic Surgery

## 2018-01-11 ENCOUNTER — Encounter (HOSPITAL_COMMUNITY): Payer: Self-pay | Admitting: Emergency Medicine

## 2018-01-11 DIAGNOSIS — G4730 Sleep apnea, unspecified: Secondary | ICD-10-CM | POA: Diagnosis not present

## 2018-01-11 DIAGNOSIS — M109 Gout, unspecified: Secondary | ICD-10-CM | POA: Insufficient documentation

## 2018-01-11 DIAGNOSIS — E669 Obesity, unspecified: Secondary | ICD-10-CM | POA: Diagnosis not present

## 2018-01-11 DIAGNOSIS — Z6826 Body mass index (BMI) 26.0-26.9, adult: Secondary | ICD-10-CM | POA: Diagnosis not present

## 2018-01-11 DIAGNOSIS — M1990 Unspecified osteoarthritis, unspecified site: Secondary | ICD-10-CM | POA: Insufficient documentation

## 2018-01-11 DIAGNOSIS — M1711 Unilateral primary osteoarthritis, right knee: Principal | ICD-10-CM | POA: Insufficient documentation

## 2018-01-11 DIAGNOSIS — E785 Hyperlipidemia, unspecified: Secondary | ICD-10-CM | POA: Insufficient documentation

## 2018-01-11 DIAGNOSIS — Z79899 Other long term (current) drug therapy: Secondary | ICD-10-CM | POA: Insufficient documentation

## 2018-01-11 DIAGNOSIS — Z859 Personal history of malignant neoplasm, unspecified: Secondary | ICD-10-CM | POA: Insufficient documentation

## 2018-01-11 DIAGNOSIS — Z87442 Personal history of urinary calculi: Secondary | ICD-10-CM | POA: Diagnosis not present

## 2018-01-11 DIAGNOSIS — M179 Osteoarthritis of knee, unspecified: Secondary | ICD-10-CM | POA: Diagnosis present

## 2018-01-11 DIAGNOSIS — I7300 Raynaud's syndrome without gangrene: Secondary | ICD-10-CM | POA: Insufficient documentation

## 2018-01-11 DIAGNOSIS — Z7982 Long term (current) use of aspirin: Secondary | ICD-10-CM | POA: Diagnosis not present

## 2018-01-11 DIAGNOSIS — I209 Angina pectoris, unspecified: Secondary | ICD-10-CM | POA: Diagnosis not present

## 2018-01-11 DIAGNOSIS — J449 Chronic obstructive pulmonary disease, unspecified: Secondary | ICD-10-CM | POA: Insufficient documentation

## 2018-01-11 DIAGNOSIS — Z87891 Personal history of nicotine dependence: Secondary | ICD-10-CM | POA: Insufficient documentation

## 2018-01-11 DIAGNOSIS — G8918 Other acute postprocedural pain: Secondary | ICD-10-CM | POA: Diagnosis not present

## 2018-01-11 DIAGNOSIS — M1710 Unilateral primary osteoarthritis, unspecified knee: Secondary | ICD-10-CM | POA: Diagnosis present

## 2018-01-11 HISTORY — PX: TOTAL KNEE ARTHROPLASTY: SHX125

## 2018-01-11 LAB — TYPE AND SCREEN
ABO/RH(D): B POS
ANTIBODY SCREEN: NEGATIVE

## 2018-01-11 SURGERY — ARTHROPLASTY, KNEE, TOTAL
Anesthesia: Spinal | Site: Knee | Laterality: Right

## 2018-01-11 MED ORDER — ONDANSETRON HCL 4 MG/2ML IJ SOLN
INTRAMUSCULAR | Status: DC | PRN
  Administered 2018-01-11: 4 mg via INTRAVENOUS

## 2018-01-11 MED ORDER — TRAMADOL HCL 50 MG PO TABS
50.0000 mg | ORAL_TABLET | Freq: Four times a day (QID) | ORAL | Status: DC | PRN
  Administered 2018-01-12: 100 mg via ORAL
  Administered 2018-01-12: 50 mg via ORAL
  Administered 2018-01-13: 100 mg via ORAL
  Filled 2018-01-11 (×3): qty 2

## 2018-01-11 MED ORDER — METHOCARBAMOL 500 MG IVPB - SIMPLE MED
500.0000 mg | Freq: Four times a day (QID) | INTRAVENOUS | Status: DC | PRN
  Filled 2018-01-11: qty 50

## 2018-01-11 MED ORDER — BUPIVACAINE LIPOSOME 1.3 % IJ SUSP
INTRAMUSCULAR | Status: DC | PRN
  Administered 2018-01-11: 20 mL

## 2018-01-11 MED ORDER — HYDROCODONE-ACETAMINOPHEN 7.5-325 MG PO TABS: 1.0000 | ORAL_TABLET | ORAL | Status: DC | PRN

## 2018-01-11 MED ORDER — LACTATED RINGERS IV SOLN
INTRAVENOUS | Status: DC
  Administered 2018-01-11 (×3): via INTRAVENOUS

## 2018-01-11 MED ORDER — METHOCARBAMOL 500 MG PO TABS
500.0000 mg | ORAL_TABLET | Freq: Four times a day (QID) | ORAL | Status: DC | PRN
  Administered 2018-01-12 – 2018-01-13 (×5): 500 mg via ORAL
  Filled 2018-01-11 (×5): qty 1

## 2018-01-11 MED ORDER — DEXAMETHASONE SODIUM PHOSPHATE 10 MG/ML IJ SOLN
8.0000 mg | Freq: Once | INTRAMUSCULAR | Status: AC
  Administered 2018-01-11: 8 mg via INTRAVENOUS

## 2018-01-11 MED ORDER — TRANEXAMIC ACID-NACL 1000-0.7 MG/100ML-% IV SOLN
1000.0000 mg | Freq: Once | INTRAVENOUS | Status: AC
  Administered 2018-01-11: 1000 mg via INTRAVENOUS
  Filled 2018-01-11: qty 100

## 2018-01-11 MED ORDER — ONDANSETRON HCL 4 MG/2ML IJ SOLN: 4.0000 mg | Freq: Four times a day (QID) | INTRAMUSCULAR | Status: DC | PRN

## 2018-01-11 MED ORDER — CLONIDINE HCL (ANALGESIA) 100 MCG/ML EP SOLN
EPIDURAL | Status: DC | PRN
  Administered 2018-01-11: 50 ug

## 2018-01-11 MED ORDER — ATORVASTATIN CALCIUM 20 MG PO TABS
20.0000 mg | ORAL_TABLET | Freq: Every day | ORAL | Status: DC
  Administered 2018-01-11 – 2018-01-12 (×2): 20 mg via ORAL
  Filled 2018-01-11 (×2): qty 1

## 2018-01-11 MED ORDER — ASPIRIN EC 325 MG PO TBEC
325.0000 mg | DELAYED_RELEASE_TABLET | Freq: Two times a day (BID) | ORAL | Status: DC
  Administered 2018-01-12 – 2018-01-13 (×3): 325 mg via ORAL
  Filled 2018-01-11 (×3): qty 1

## 2018-01-11 MED ORDER — BUPIVACAINE IN DEXTROSE 0.75-8.25 % IT SOLN
INTRATHECAL | Status: DC | PRN
  Administered 2018-01-11: 1.8 mL via INTRATHECAL

## 2018-01-11 MED ORDER — TRAMADOL HCL 50 MG PO TABS: 50.0000 mg | ORAL_TABLET | Freq: Four times a day (QID) | ORAL | Status: DC | PRN

## 2018-01-11 MED ORDER — ACETAMINOPHEN 10 MG/ML IV SOLN
1000.0000 mg | Freq: Four times a day (QID) | INTRAVENOUS | Status: DC
  Administered 2018-01-11: 1000 mg via INTRAVENOUS
  Filled 2018-01-11: qty 100

## 2018-01-11 MED ORDER — FENTANYL CITRATE (PF) 100 MCG/2ML IJ SOLN
INTRAMUSCULAR | Status: AC
  Filled 2018-01-11: qty 2

## 2018-01-11 MED ORDER — MIDAZOLAM HCL 5 MG/5ML IJ SOLN
INTRAMUSCULAR | Status: DC | PRN
  Administered 2018-01-11: 2 mg via INTRAVENOUS

## 2018-01-11 MED ORDER — FENTANYL CITRATE (PF) 100 MCG/2ML IJ SOLN
INTRAMUSCULAR | Status: DC | PRN
  Administered 2018-01-11: 50 ug via INTRAVENOUS

## 2018-01-11 MED ORDER — MEPERIDINE HCL 50 MG/ML IJ SOLN: 6.2500 mg | INTRAMUSCULAR | Status: DC | PRN

## 2018-01-11 MED ORDER — DEXAMETHASONE SODIUM PHOSPHATE 10 MG/ML IJ SOLN
INTRAMUSCULAR | Status: AC
  Filled 2018-01-11: qty 1

## 2018-01-11 MED ORDER — EPHEDRINE 5 MG/ML INJ
INTRAVENOUS | Status: AC
  Filled 2018-01-11: qty 10

## 2018-01-11 MED ORDER — HYDROMORPHONE HCL 1 MG/ML IJ SOLN
0.5000 mg | INTRAMUSCULAR | Status: DC | PRN
  Administered 2018-01-11 – 2018-01-12 (×3): 1 mg via INTRAVENOUS
  Filled 2018-01-11 (×3): qty 1

## 2018-01-11 MED ORDER — MIDAZOLAM HCL 2 MG/2ML IJ SOLN
INTRAMUSCULAR | Status: AC
  Filled 2018-01-11: qty 2

## 2018-01-11 MED ORDER — PHENOL 1.4 % MT LIQD
1.0000 | OROMUCOSAL | Status: DC | PRN
  Filled 2018-01-11: qty 177

## 2018-01-11 MED ORDER — MENTHOL 3 MG MT LOZG: 1.0000 | LOZENGE | OROMUCOSAL | Status: DC | PRN

## 2018-01-11 MED ORDER — SODIUM CHLORIDE 0.9 % IR SOLN
Status: DC | PRN
  Administered 2018-01-11: 1000 mL

## 2018-01-11 MED ORDER — DIPHENHYDRAMINE HCL 12.5 MG/5ML PO ELIX: 12.5000 mg | ORAL_SOLUTION | ORAL | Status: DC | PRN

## 2018-01-11 MED ORDER — DEXAMETHASONE SODIUM PHOSPHATE 10 MG/ML IJ SOLN
10.0000 mg | Freq: Once | INTRAMUSCULAR | Status: AC
  Administered 2018-01-12: 10 mg via INTRAVENOUS
  Filled 2018-01-11: qty 1

## 2018-01-11 MED ORDER — FLEET ENEMA 7-19 GM/118ML RE ENEM: 1.0000 | ENEMA | Freq: Once | RECTAL | Status: DC | PRN

## 2018-01-11 MED ORDER — ACETAMINOPHEN 500 MG PO TABS
1000.0000 mg | ORAL_TABLET | Freq: Four times a day (QID) | ORAL | Status: DC
  Administered 2018-01-11: 1000 mg via ORAL
  Filled 2018-01-11: qty 2

## 2018-01-11 MED ORDER — OXYCODONE HCL 5 MG PO TABS: 5.0000 mg | ORAL_TABLET | ORAL | Status: DC | PRN

## 2018-01-11 MED ORDER — ONDANSETRON HCL 4 MG PO TABS: 4.0000 mg | ORAL_TABLET | Freq: Four times a day (QID) | ORAL | Status: DC | PRN

## 2018-01-11 MED ORDER — ONDANSETRON HCL 4 MG/2ML IJ SOLN
INTRAMUSCULAR | Status: AC
  Filled 2018-01-11: qty 2

## 2018-01-11 MED ORDER — EPHEDRINE SULFATE-NACL 50-0.9 MG/10ML-% IV SOSY
PREFILLED_SYRINGE | INTRAVENOUS | Status: DC | PRN
  Administered 2018-01-11 (×3): 5 mg via INTRAVENOUS

## 2018-01-11 MED ORDER — GABAPENTIN 300 MG PO CAPS
300.0000 mg | ORAL_CAPSULE | Freq: Once | ORAL | Status: AC
  Administered 2018-01-11: 300 mg via ORAL
  Filled 2018-01-11: qty 1

## 2018-01-11 MED ORDER — PROPOFOL 10 MG/ML IV BOLUS
INTRAVENOUS | Status: AC
  Filled 2018-01-11: qty 60

## 2018-01-11 MED ORDER — BUPIVACAINE HCL (PF) 0.5 % IJ SOLN
INTRAMUSCULAR | Status: DC | PRN
  Administered 2018-01-11: 30 mL

## 2018-01-11 MED ORDER — GABAPENTIN 300 MG PO CAPS
300.0000 mg | ORAL_CAPSULE | Freq: Three times a day (TID) | ORAL | Status: DC
  Administered 2018-01-11 – 2018-01-13 (×6): 300 mg via ORAL
  Filled 2018-01-11 (×6): qty 1

## 2018-01-11 MED ORDER — CHLORHEXIDINE GLUCONATE 4 % EX LIQD: 60.0000 mL | Freq: Once | CUTANEOUS | Status: DC

## 2018-01-11 MED ORDER — METOCLOPRAMIDE HCL 5 MG/ML IJ SOLN: 5.0000 mg | Freq: Three times a day (TID) | INTRAMUSCULAR | Status: DC | PRN

## 2018-01-11 MED ORDER — PROPOFOL 10 MG/ML IV BOLUS
INTRAVENOUS | Status: DC | PRN
  Administered 2018-01-11: 40 mg via INTRAVENOUS

## 2018-01-11 MED ORDER — BUPIVACAINE HCL (PF) 0.25 % IJ SOLN
INTRAMUSCULAR | Status: AC
  Filled 2018-01-11: qty 30

## 2018-01-11 MED ORDER — BISACODYL 10 MG RE SUPP: 10.0000 mg | Freq: Every day | RECTAL | Status: DC | PRN

## 2018-01-11 MED ORDER — CEFAZOLIN SODIUM-DEXTROSE 2-4 GM/100ML-% IV SOLN
2.0000 g | INTRAVENOUS | Status: AC
  Administered 2018-01-11: 2 g via INTRAVENOUS
  Filled 2018-01-11: qty 100

## 2018-01-11 MED ORDER — DOCUSATE SODIUM 100 MG PO CAPS
100.0000 mg | ORAL_CAPSULE | Freq: Two times a day (BID) | ORAL | Status: DC
  Administered 2018-01-11 – 2018-01-13 (×5): 100 mg via ORAL
  Filled 2018-01-11 (×5): qty 1

## 2018-01-11 MED ORDER — HYDROCODONE-ACETAMINOPHEN 5-325 MG PO TABS
1.0000 | ORAL_TABLET | ORAL | Status: DC | PRN
  Administered 2018-01-11 – 2018-01-13 (×9): 2 via ORAL
  Filled 2018-01-11 (×9): qty 2

## 2018-01-11 MED ORDER — SODIUM CHLORIDE 0.9 % IJ SOLN
INTRAMUSCULAR | Status: AC
  Filled 2018-01-11: qty 50

## 2018-01-11 MED ORDER — CEFAZOLIN SODIUM-DEXTROSE 2-4 GM/100ML-% IV SOLN
2.0000 g | Freq: Four times a day (QID) | INTRAVENOUS | Status: AC
  Administered 2018-01-11 (×2): 2 g via INTRAVENOUS
  Filled 2018-01-11 (×2): qty 100

## 2018-01-11 MED ORDER — SODIUM CHLORIDE 0.9 % IV SOLN
INTRAVENOUS | Status: DC
  Administered 2018-01-11 – 2018-01-12 (×2): via INTRAVENOUS

## 2018-01-11 MED ORDER — SODIUM CHLORIDE 0.9 % IJ SOLN
INTRAMUSCULAR | Status: AC
  Filled 2018-01-11: qty 20

## 2018-01-11 MED ORDER — POLYETHYLENE GLYCOL 3350 17 G PO PACK
17.0000 g | PACK | Freq: Every day | ORAL | Status: DC | PRN
  Administered 2018-01-13: 17 g via ORAL
  Filled 2018-01-11: qty 1

## 2018-01-11 MED ORDER — TRANEXAMIC ACID-NACL 1000-0.7 MG/100ML-% IV SOLN
1000.0000 mg | INTRAVENOUS | Status: AC
  Administered 2018-01-11: 1000 mg via INTRAVENOUS
  Filled 2018-01-11: qty 100

## 2018-01-11 MED ORDER — PROPOFOL 500 MG/50ML IV EMUL
INTRAVENOUS | Status: DC | PRN
  Administered 2018-01-11: 50 ug/kg/min via INTRAVENOUS

## 2018-01-11 MED ORDER — SODIUM CHLORIDE 0.9 % IJ SOLN
INTRAMUSCULAR | Status: DC | PRN
  Administered 2018-01-11: 60 mL

## 2018-01-11 MED ORDER — METOCLOPRAMIDE HCL 5 MG PO TABS: 5.0000 mg | ORAL_TABLET | Freq: Three times a day (TID) | ORAL | Status: DC | PRN

## 2018-01-11 MED ORDER — FENTANYL CITRATE (PF) 100 MCG/2ML IJ SOLN: 25.0000 ug | INTRAMUSCULAR | Status: DC | PRN

## 2018-01-11 MED ORDER — PROMETHAZINE HCL 25 MG/ML IJ SOLN: 6.2500 mg | INTRAMUSCULAR | Status: DC | PRN

## 2018-01-11 MED ORDER — STERILE WATER FOR IRRIGATION IR SOLN
Status: DC | PRN
  Administered 2018-01-11: 2000 mL

## 2018-01-11 SURGICAL SUPPLY — 66 items
ATTUNE MED DOME PAT 38 KNEE (Knees) ×1 IMPLANT
ATTUNE MED DOME PAT 38MM KNEE (Knees) ×1 IMPLANT
ATTUNE PS FEM RT SZ 7 CEM KNEE (Femur) ×2 IMPLANT
ATTUNE PSRP INSR SZ7 8 KNEE (Insert) ×1 IMPLANT
ATTUNE PSRP INSR SZ7 8MM KNEE (Insert) ×1 IMPLANT
BAG SPEC THK2 15X12 ZIP CLS (MISCELLANEOUS) ×1
BAG ZIPLOCK 12X15 (MISCELLANEOUS) ×3 IMPLANT
BANDAGE ACE 6X5 VEL STRL LF (GAUZE/BANDAGES/DRESSINGS) ×3 IMPLANT
BANDAGE ELASTIC 6 VELCRO ST LF (GAUZE/BANDAGES/DRESSINGS) ×2 IMPLANT
BASE TIBIA ATTUNE KNEE SYS SZ6 (Knees) IMPLANT
BLADE SAG 18X100X1.27 (BLADE) ×3 IMPLANT
BLADE SAW SGTL 11.0X1.19X90.0M (BLADE) ×3 IMPLANT
BNDG CMPR 82X61 PLY HI ABS (GAUZE/BANDAGES/DRESSINGS) ×1
BNDG CONFORM 6X.82 1P STRL (GAUZE/BANDAGES/DRESSINGS) ×2 IMPLANT
BOWL SMART MIX CTS (DISPOSABLE) ×3 IMPLANT
BSPLAT TIB 6 CMNT ROT PLAT STR (Knees) ×1 IMPLANT
CEMENT HV SMART SET (Cement) ×6 IMPLANT
CLOSURE WOUND 1/2 X4 (GAUZE/BANDAGES/DRESSINGS) ×2
COVER SURGICAL LIGHT HANDLE (MISCELLANEOUS) ×3 IMPLANT
COVER WAND RF STERILE (DRAPES) IMPLANT
CUFF TOURN SGL QUICK 34 (TOURNIQUET CUFF) ×3
CUFF TRNQT CYL 34X4X40X1 (TOURNIQUET CUFF) ×1 IMPLANT
DECANTER SPIKE VIAL GLASS SM (MISCELLANEOUS) ×3 IMPLANT
DRAPE U-SHAPE 47X51 STRL (DRAPES) ×3 IMPLANT
DRSG ADAPTIC 3X8 NADH LF (GAUZE/BANDAGES/DRESSINGS) ×3 IMPLANT
DRSG PAD ABDOMINAL 8X10 ST (GAUZE/BANDAGES/DRESSINGS) ×3 IMPLANT
DURAPREP 26ML APPLICATOR (WOUND CARE) ×3 IMPLANT
ELECT REM PT RETURN 15FT ADLT (MISCELLANEOUS) ×3 IMPLANT
EVACUATOR 1/8 PVC DRAIN (DRAIN) ×3 IMPLANT
GAUZE SPONGE 4X4 12PLY STRL (GAUZE/BANDAGES/DRESSINGS) ×3 IMPLANT
GLOVE BIO SURGEON STRL SZ7 (GLOVE) ×3 IMPLANT
GLOVE BIO SURGEON STRL SZ8 (GLOVE) ×3 IMPLANT
GLOVE BIOGEL PI IND STRL 6.5 (GLOVE) ×1 IMPLANT
GLOVE BIOGEL PI IND STRL 7.0 (GLOVE) ×1 IMPLANT
GLOVE BIOGEL PI IND STRL 8 (GLOVE) ×1 IMPLANT
GLOVE BIOGEL PI INDICATOR 6.5 (GLOVE) ×2
GLOVE BIOGEL PI INDICATOR 7.0 (GLOVE) ×2
GLOVE BIOGEL PI INDICATOR 8 (GLOVE) ×2
GLOVE SURG SS PI 6.5 STRL IVOR (GLOVE) ×3 IMPLANT
GOWN STRL REUS W/TWL LRG LVL3 (GOWN DISPOSABLE) ×6 IMPLANT
GOWN STRL REUS W/TWL XL LVL3 (GOWN DISPOSABLE) ×3 IMPLANT
HANDPIECE INTERPULSE COAX TIP (DISPOSABLE) ×3
HOLDER FOLEY CATH W/STRAP (MISCELLANEOUS) IMPLANT
IMMOBILIZER KNEE 20 (SOFTGOODS) ×3
IMMOBILIZER KNEE 20 THIGH 36 (SOFTGOODS) ×1 IMPLANT
MANIFOLD NEPTUNE II (INSTRUMENTS) ×3 IMPLANT
NS IRRIG 1000ML POUR BTL (IV SOLUTION) ×3 IMPLANT
PACK TOTAL KNEE CUSTOM (KITS) ×3 IMPLANT
PAD ABD 8X10 STRL (GAUZE/BANDAGES/DRESSINGS) ×2 IMPLANT
PADDING CAST COTTON 6X4 STRL (CAST SUPPLIES) ×9 IMPLANT
PIN STEINMAN FIXATION KNEE (PIN) ×2 IMPLANT
PIN THREADED HEADED SIGMA (PIN) ×2 IMPLANT
POSITIONER SURGICAL ARM (MISCELLANEOUS) ×3 IMPLANT
SET HNDPC FAN SPRY TIP SCT (DISPOSABLE) ×1 IMPLANT
STRIP CLOSURE SKIN 1/2X4 (GAUZE/BANDAGES/DRESSINGS) ×4 IMPLANT
SUT MNCRL AB 4-0 PS2 18 (SUTURE) ×3 IMPLANT
SUT STRATAFIX 0 PDS 27 VIOLET (SUTURE) ×3
SUT VIC AB 2-0 CT1 27 (SUTURE) ×9
SUT VIC AB 2-0 CT1 TAPERPNT 27 (SUTURE) ×3 IMPLANT
SUTURE STRATFX 0 PDS 27 VIOLET (SUTURE) ×1 IMPLANT
TAPE STRIPS DRAPE STRL (GAUZE/BANDAGES/DRESSINGS) ×2 IMPLANT
TIBIA ATTUNE KNEE SYS BASE SZ6 (Knees) ×3 IMPLANT
TRAY FOLEY MTR SLVR 16FR STAT (SET/KITS/TRAYS/PACK) ×3 IMPLANT
WATER STERILE IRR 1000ML POUR (IV SOLUTION) ×6 IMPLANT
WRAP KNEE MAXI GEL POST OP (GAUZE/BANDAGES/DRESSINGS) ×3 IMPLANT
YANKAUER SUCT BULB TIP 10FT TU (MISCELLANEOUS) ×3 IMPLANT

## 2018-01-11 NOTE — Discharge Instructions (Signed)
° °Dr. Frank Aluisio °Total Joint Specialist °[facility] °[address] °[place], [place] [address] °[phone] ° °TOTAL KNEE REPLACEMENT POSTOPERATIVE DIRECTIONS ° °Knee Rehabilitation, Guidelines Following Surgery  °Results after knee surgery are often greatly improved when you follow the exercise, range of motion and muscle strengthening exercises prescribed by your doctor. Safety measures are also important to protect the knee from further injury. Any time any of these exercises cause you to have increased pain or swelling in your knee joint, decrease the amount until you are comfortable again and slowly increase them. If you have problems or questions, call your caregiver or physical therapist for advice.  ° °HOME CARE INSTRUCTIONS  °• Remove items at home which could result in a fall. This includes throw rugs or furniture in walking pathways.  °· ICE to the affected knee every three hours for 30 minutes at a time and then as needed for pain and swelling.  Continue to use ice on the knee for pain and swelling from surgery. You may notice swelling that will progress down to the foot and ankle.  This is normal after surgery.  Elevate the leg when you are not up walking on it.   °· Continue to use the breathing machine which will help keep your temperature down.  It is common for your temperature to cycle up and down following surgery, especially at night when you are not up moving around and exerting yourself.  The breathing machine keeps your lungs expanded and your temperature down. °· Do not place pillow under knee, focus on keeping the knee straight while resting ° °DIET °You may resume your previous home diet once your are discharged from the hospital. ° °DRESSING / WOUND CARE / SHOWERING °You may shower 3 days after surgery, but keep the wounds dry during showering.  You may use an occlusive plastic wrap (Press'n Seal for example), NO SOAKING/SUBMERGING IN THE BATHTUB.  If the bandage  gets wet, change with a clean dry gauze.  If the incision gets wet, pat the wound dry with a clean towel. °You may start showering once you are discharged home but do not submerge the incision under water. Just pat the incision dry and apply a dry gauze dressing on daily. °Change the surgical dressing daily and reapply a dry dressing each time. ° °ACTIVITY °Walk with your walker as instructed. °Use walker as long as suggested by your caregivers. °Avoid periods of inactivity such as sitting longer than an hour when not asleep. This helps prevent blood clots.  °You may resume a sexual relationship in one month or when given the OK by your doctor.  °You may return to work once you are cleared by your doctor.  °Do not drive a car for 6 weeks or until released by you surgeon.  °Do not drive while taking narcotics. ° °WEIGHT BEARING °Weight bearing as tolerated with assist device (walker, cane, etc) as directed, use it as long as suggested by your surgeon or therapist, typically at least 4-6 weeks. ° °POSTOPERATIVE CONSTIPATION PROTOCOL °Constipation - defined medically as fewer than three stools per week and severe constipation as less than one stool per week. ° °One of the most common issues patients have following surgery is constipation.  Even if you have a regular bowel pattern at home, your normal regimen is likely to be disrupted due to multiple reasons following surgery.  Combination of anesthesia, postoperative narcotics, change in appetite and fluid intake all can affect your bowels.    In order to avoid complications following surgery, here are some recommendations in order to help you during your recovery period. ° °Colace (docusate) - Pick up an over-the-counter form of Colace or another stool softener and take twice a day as long as you are requiring postoperative pain medications.  Take with a full glass of water daily.  If you experience loose stools or diarrhea, hold the colace until you stool forms back  up.  If your symptoms do not get better within 1 week or if they get worse, check with your doctor. ° °Dulcolax (bisacodyl) - Pick up over-the-counter and take as directed by the product packaging as needed to assist with the movement of your bowels.  Take with a full glass of water.  Use this product as needed if not relieved by Colace only.  ° °MiraLax (polyethylene glycol) - Pick up over-the-counter to have on hand.  MiraLax is a solution that will increase the amount of water in your bowels to assist with bowel movements.  Take as directed and can mix with a glass of water, juice, soda, coffee, or tea.  Take if you go more than two days without a movement. °Do not use MiraLax more than once per day. Call your doctor if you are still constipated or irregular after using this medication for 7 days in a row. ° °If you continue to have problems with postoperative constipation, please contact the office for further assistance and recommendations.  If you experience "the worst abdominal pain ever" or develop nausea or vomiting, please contact the office immediatly for further recommendations for treatment. ° °ITCHING ° If you experience itching with your medications, try taking only a single pain pill, or even half a pain pill at a time.  You can also use Benadryl over the counter for itching or also to help with sleep.  ° °TED HOSE STOCKINGS °Wear the elastic stockings on both legs for three weeks following surgery during the day but you may remove then at night for sleeping. ° °MEDICATIONS °See your medication summary on the “After Visit Summary” that the nursing staff will review with you prior to discharge.  You may have some home medications which will be placed on hold until you complete the course of blood thinner medication.  It is important for you to complete the blood thinner medication as prescribed by your surgeon.  Continue your approved medications as instructed at time of discharge. ° °PRECAUTIONS °If  you experience chest pain or shortness of breath - call 911 immediately for transfer to the hospital emergency department.  °If you develop a fever greater that 101 F, purulent drainage from wound, increased redness or drainage from wound, foul odor from the wound/dressing, or calf pain - CONTACT YOUR SURGEON.   °                                                °FOLLOW-UP APPOINTMENTS °Make sure you keep all of your appointments after your operation with your surgeon and caregivers. You should call the office at the above phone number and make an appointment for approximately two weeks after the date of your surgery or on the date instructed by your surgeon outlined in the "After Visit Summary". ° ° °RANGE OF MOTION AND STRENGTHENING EXERCISES  °Rehabilitation of the knee is important following a knee injury or   an operation. After just a few days of immobilization, the muscles of the thigh which control the knee become weakened and shrink (atrophy). Knee exercises are designed to build up the tone and strength of the thigh muscles and to improve knee motion. Often times heat used for twenty to thirty minutes before working out will loosen up your tissues and help with improving the range of motion but do not use heat for the first two weeks following surgery. These exercises can be done on a training (exercise) mat, on the floor, on a table or on a bed. Use what ever works the best and is most comfortable for you Knee exercises include:  °• Leg Lifts - While your knee is still immobilized in a splint or cast, you can do straight leg raises. Lift the leg to 60 degrees, hold for 3 sec, and slowly lower the leg. Repeat 10-20 times 2-3 times daily. Perform this exercise against resistance later as your knee gets better.  °• Quad and Hamstring Sets - Tighten up the muscle on the front of the thigh (Quad) and hold for 5-10 sec. Repeat this 10-20 times hourly. Hamstring sets are done by pushing the foot backward against an  object and holding for 5-10 sec. Repeat as with quad sets.  °· Leg Slides: Lying on your back, slowly slide your foot toward your buttocks, bending your knee up off the floor (only go as far as is comfortable). Then slowly slide your foot back down until your leg is flat on the floor again. °· Angel Wings: Lying on your back spread your legs to the side as far apart as you can without causing discomfort.  °A rehabilitation program following serious knee injuries can speed recovery and prevent re-injury in the future due to weakened muscles. Contact your doctor or a physical therapist for more information on knee rehabilitation.  ° °IF YOU ARE TRANSFERRED TO A SKILLED REHAB FACILITY °If the patient is transferred to a skilled rehab facility following release from the hospital, a list of the current medications will be sent to the facility for the patient to continue.  When discharged from the skilled rehab facility, please have the facility set up the patient's Home Health Physical Therapy prior to being released. Also, the skilled facility will be responsible for providing the patient with their medications at time of release from the facility to include their pain medication, the muscle relaxants, and their blood thinner medication. If the patient is still at the rehab facility at time of the two week follow up appointment, the skilled rehab facility will also need to assist the patient in arranging follow up appointment in our office and any transportation needs. ° °MAKE SURE YOU:  °• Understand these instructions.  °• Get help right away if you are not doing well or get worse.  ° ° °Pick up stool softner and laxative for home use following surgery while on pain medications. °Do not submerge incision under water. °Please use good hand washing techniques while changing dressing each day. °May shower starting three days after surgery. °Please use a clean towel to pat the incision dry following showers. °Continue to  use ice for pain and swelling after surgery. °Do not use any lotions or creams on the incision until instructed by your surgeon. ° °

## 2018-01-11 NOTE — Op Note (Signed)
OPERATIVE REPORT-TOTAL KNEE ARTHROPLASTY   Pre-operative diagnosis- Osteoarthritis  Right knee(s)  Post-operative diagnosis- Osteoarthritis Right knee(s)  Procedure-  Right  Total Knee Arthroplasty (Depuy Attune)  Surgeon- Jordan Roth. Jordan Clingan, MD  Assistant- Jordan Duty, PA-C   Anesthesia-  Adductor canal block and spinal  EBL- 25 ml   Drains Hemovac  Tourniquet time-  Total Tourniquet Time Documented: Thigh (Right) - 40 minutes Total: Thigh (Right) - 40 minutes     Complications- None  Condition-PACU - hemodynamically stable.   Brief Clinical Note  Jordan Roth is a 62 y.o. year old male with end stage OA of his right knee with progressively worsening pain and dysfunction. He has constant pain, with activity and at rest and significant functional deficits with difficulties even with ADLs. He has had extensive non-op management including analgesics, injections of cortisone and viscosupplements, and home exercise program, but remains in significant pain with significant dysfunction. Radiographs show bone on bone arthritis medial and patelofemoral. He presents now for right Total Knee Arthroplasty.    Procedure in detail---   The patient is brought into the operating room and positioned supine on the operating table. After successful administration of  Adductor canal block and spinal,   a tourniquet is placed high on the  Right thigh(s) and the lower extremity is prepped and draped in the usual sterile fashion. Time out is performed by the operating team and then the  Right lower extremity is wrapped in Esmarch, knee flexed and the tourniquet inflated to 300 mmHg.       A midline incision is made with a ten blade through the subcutaneous tissue to the level of the extensor mechanism. A fresh blade is used to make a medial parapatellar arthrotomy. Soft tissue over the proximal medial tibia is subperiosteally elevated to the joint line with a knife and into the semimembranosus  bursa with a Cobb elevator. Soft tissue over the proximal lateral tibia is elevated with attention being paid to avoiding the patellar tendon on the tibial tubercle. The patella is everted, knee flexed 90 degrees and the ACL and PCL are removed. Findings are bone on bone medial and patellofemoral with large global osteophytes.        The drill is used to create a starting hole in the distal femur and the canal is thoroughly irrigated with sterile saline to remove the fatty contents. The 5 degree Right  valgus alignment guide is placed into the femoral canal and the distal femoral cutting block is pinned to remove 9 mm off the distal femur. Resection is made with an oscillating saw.      The tibia is subluxed forward and the menisci are removed. The extramedullary alignment guide is placed referencing proximally at the medial aspect of the tibial tubercle and distally along the second metatarsal axis and tibial crest. The block is pinned to remove 63mm off the more deficient medial  side. Resection is made with an oscillating saw. Size 6is the most appropriate size for the tibia and the proximal tibia is prepared with the modular drill and keel punch for that size.      The femoral sizing guide is placed and size 7 is most appropriate. Rotation is marked off the epicondylar axis and confirmed by creating a rectangular flexion gap at 90 degrees. The size 7 cutting block is pinned in this rotation and the anterior, posterior and chamfer cuts are made with the oscillating saw. The intercondylar block is then placed and that cut  is made.      Trial size 6 tibial component, trial size 7 posterior stabilized femur and a 8  mm posterior stabilized rotating platform insert trial is placed. Full extension is achieved with excellent varus/valgus and anterior/posterior balance throughout full range of motion. The patella is everted and thickness measured to be 22  mm. Free hand resection is taken to 12 mm, a 38 template is  placed, lug holes are drilled, trial patella is placed, and it tracks normally. Osteophytes are removed off the posterior femur with the trial in place. All trials are removed and the cut bone surfaces prepared with pulsatile lavage. Cement is mixed and once ready for implantation, the size 6 tibial implant, size  7 posterior stabilized femoral component, and the size 38 patella are cemented in place and the patella is held with the clamp. The trial insert is placed and the knee held in full extension. The Exparel (20 ml mixed with 60 ml saline) is injected into the extensor mechanism, posterior capsule, medial and lateral gutters and subcutaneous tissues.  All extruded cement is removed and once the cement is hard the permanent 8 mm posterior stabilized rotating platform insert is placed into the tibial tray.      The wound is copiously irrigated with saline solution and the extensor mechanism closed over a hemovac drain with #1 V-loc suture. The tourniquet is released for a total tourniquet time of 40  minutes. Flexion against gravity is 140 degrees and the patella tracks normally. Subcutaneous tissue is closed with 2.0 vicryl and subcuticular with running 4.0 Monocryl. The incision is cleaned and dried and steri-strips and a bulky sterile dressing are applied. The limb is placed into a knee immobilizer and the patient is awakened and transported to recovery in stable condition.      Please note that a surgical assistant was a medical necessity for this procedure in order to perform it in a safe and expeditious manner. Surgical assistant was necessary to retract the ligaments and vital neurovascular structures to prevent injury to them and also necessary for proper positioning of the limb to allow for anatomic placement of the prosthesis.   Jordan Roth Jordan Digilio, MD    01/11/2018, 8:28 AM

## 2018-01-11 NOTE — Transfer of Care (Signed)
Immediate Anesthesia Transfer of Care Note  Patient: Jordan Roth  Procedure(s) Performed: RIGHT TOTAL KNEE ARTHROPLASTY (Right Knee)  Patient Location: PACU  Anesthesia Type:Spinal  Level of Consciousness: drowsy and patient cooperative  Airway & Oxygen Therapy: Patient Spontanous Breathing and Patient connected to face mask oxygen  Post-op Assessment: Report given to RN and Post -op Vital signs reviewed and stable  Post vital signs: Reviewed and stable  Last Vitals:  Vitals Value Taken Time  BP    Temp    Pulse    Resp    SpO2      Last Pain:  Vitals:   01/11/18 0554  TempSrc: Oral  PainSc: 3       Patients Stated Pain Goal: 4 (73/54/30 1484)  Complications: No apparent anesthesia complications

## 2018-01-11 NOTE — Anesthesia Procedure Notes (Signed)
Spinal  Start time: 01/11/2018 7:19 AM End time: 01/11/2018 7:21 AM Staffing Anesthesiologist: Nolon Nations, MD Resident/CRNA: Montel Clock, CRNA Performed: resident/CRNA  Preanesthetic Checklist Completed: patient identified, surgical consent, pre-op evaluation, timeout performed, IV checked, risks and benefits discussed and monitors and equipment checked Spinal Block Patient position: sitting Prep: DuraPrep Patient monitoring: heart rate, cardiac monitor, continuous pulse ox and blood pressure Approach: midline Location: L3-4 Injection technique: single-shot Needle Needle type: Pencan  Needle gauge: 24 G Needle length: 10 cm Needle insertion depth: 7 cm Assessment Sensory level: T6

## 2018-01-11 NOTE — Evaluation (Signed)
Physical Therapy Evaluation Patient Details Name: Jordan Roth MRN: [id number] DOB: 1955/09/05 Today's Date: 01/11/2018   History of Present Illness  s/p R TKA  Clinical Impression  Pt is s/p TKA resulting in the deficits listed below (see PT Problem List).  Pt amb 110' with RW and min/guard assist; should continue to progress well--pt plans to d/c tomorrow or the next day Pt will benefit from skilled PT to increase their independence and safety with mobility to allow discharge to the venue listed below.       Follow Up Recommendations Follow surgeon's recommendation for DC plan and follow-up therapies    Equipment Recommendations  None recommended by PT    Recommendations for Other Services       Precautions / Restrictions Precautions Precautions: Fall;Knee Precaution Comments: IND SLR today Required Braces or Orthoses: Knee Immobilizer - Right Knee Immobilizer - Right: Discontinue once straight leg raise with < 10 degree lag Restrictions Weight Bearing Restrictions: No Other Position/Activity Restrictions: WBAT      Mobility  Bed Mobility Overal bed mobility: Needs Assistance Bed Mobility: Supine to Sit     Supine to sit: Min guard     General bed mobility comments: for safety  Transfers Overall transfer level: Needs assistance Equipment used: Rolling walker (2 wheeled) Transfers: Sit to/from Stand Sit to Stand: Min guard         General transfer comment: cues for hand placement and RLE position  Ambulation/Gait Ambulation/Gait assistance: Min guard Gait Distance (Feet): 110 Feet Assistive device: Rolling walker (2 wheeled) Gait Pattern/deviations: Step-to pattern;Decreased weight shift to right     General Gait Details: cues for sequence, RW safety  Stairs            Wheelchair Mobility    Modified Rankin (Stroke Patients Only)       Balance                                             Pertinent Vitals/Pain  Pain Assessment: 0-10 Pain Score: 5  Pain Location: right knee Pain Intervention(s): Monitored during session;Limited activity within patient's tolerance    Home Living Family/patient expects to be discharged to:: Private residence Living Arrangements: Spouse/significant other Available Help at Discharge: Family Type of Home: House Home Access: Ramped entrance     Home Layout: One level Home Equipment: Environmental consultant - 2 wheels;Bedside commode      Prior Function Level of Independence: Independent               Hand Dominance        Extremity/Trunk Assessment   Upper Extremity Assessment Upper Extremity Assessment: Overall WFL for tasks assessed    Lower Extremity Assessment Lower Extremity Assessment: RLE deficits/detail RLE Deficits / Details: ankle WFL; knee AAROM flexion -10* to 65*; knee extension and hip  flexion 2+/5       Communication   Communication: No difficulties  Cognition Arousal/Alertness: Awake/alert Behavior During Therapy: WFL for tasks assessed/performed Overall Cognitive Status: Within Functional Limits for tasks assessed                                        General Comments      Exercises Total Joint Exercises Ankle Circles/Pumps: AROM;Both Quad Sets: Both;AROM;15 reps Heel Slides: AROM;5 reps;Right  Assessment/Plan    PT Assessment    PT Problem List         PT Treatment Interventions      PT Goals (Current goals can be found in the Care Plan section)  Acute Rehab PT Goals Time For Goal Achievement: 01/18/18 Potential to Achieve Goals: Good    Frequency     Barriers to discharge        Co-evaluation               AM-PAC PT "6 Clicks" Daily Activity  Outcome Measure Difficulty turning over in bed (including adjusting bedclothes, sheets and blankets)?: A Little Difficulty moving from lying on back to sitting on the side of the bed? : A Little Difficulty sitting down on and standing up from a  chair with arms (e.g., wheelchair, bedside commode, etc,.)?: Unable Help needed moving to and from a bed to chair (including a wheelchair)?: A Little Help needed walking in hospital room?: A Little Help needed climbing 3-5 steps with a railing? : A Little 6 Click Score: 16    End of Session Equipment Utilized During Treatment: Gait belt Activity Tolerance: Patient tolerated treatment well Patient left: in chair;with call bell/phone within reach;with family/visitor present;with chair alarm set   PT Visit Diagnosis: Difficulty in walking, not elsewhere classified (R26.2)    Time: 9381-0175 PT Time Calculation (min) (ACUTE ONLY): 35 min   Charges:   PT Evaluation $PT Eval Low Complexity: 1 Low PT Treatments $Gait Training: 8-22 mins        Kenyon Ana, PT  Pager: [phone] Acute Rehab Dept [facility]): [phone]   01/11/2018   [facility] 01/11/2018, 4:41 PM

## 2018-01-11 NOTE — Anesthesia Procedure Notes (Signed)
Anesthesia Regional Block: Adductor canal block   Pre-Anesthetic Checklist: ,, timeout performed, Correct Patient, Correct Site, Correct Laterality, Correct Procedure, Correct Position, site marked, Risks and benefits discussed,  Surgical consent,  Pre-op evaluation,  At surgeon's request and post-op pain management  Laterality: Right  Prep: chloraprep       Needles:  Injection technique: Single-shot  Needle Type: Stimiplex     Needle Length: 9cm  Needle Gauge: 21     Additional Needles:   Procedures:,,,, ultrasound used (permanent image in chart),,,,  Narrative:  Start time: 01/11/2018 7:06 AM End time: 01/11/2018 7:09 AM Injection made incrementally with aspirations every 5 mL.  Performed by: Personally  Anesthesiologist: Nolon Nations, MD  Additional Notes: BP cuff, EKG monitors applied. Sedation begun. Artery and nerve location verified with U/S and anesthetic injected incrementally, slowly, and after negative aspirations under direct u/s guidance. Good fascial /perineural spread. Tolerated well.

## 2018-01-11 NOTE — Interval H&P Note (Signed)
History and Physical Interval Note:  01/11/2018 6:30 AM  Jordan Roth  has presented today for surgery, with the diagnosis of right knee osteoarthritis  The various methods of treatment have been discussed with the patient and family. After consideration of risks, benefits and other options for treatment, the patient has consented to  Procedure(s): RIGHT TOTAL KNEE ARTHROPLASTY (Right) as a surgical intervention .  The patient's history has been reviewed, patient examined, no change in status, stable for surgery.  I have reviewed the patient's chart and labs.  Questions were answered to the patient's satisfaction.     [name]

## 2018-01-11 NOTE — Anesthesia Postprocedure Evaluation (Signed)
Anesthesia Post Note  Patient: Jordan Roth  Procedure(s) Performed: RIGHT TOTAL KNEE ARTHROPLASTY (Right Knee)     Patient location during evaluation: PACU Anesthesia Type: Spinal Level of consciousness: awake and alert Pain management: pain level controlled Vital Signs Assessment: post-procedure vital signs reviewed and stable Respiratory status: spontaneous breathing and respiratory function stable Cardiovascular status: blood pressure returned to baseline and stable Postop Assessment: spinal receding Anesthetic complications: no    Last Vitals:  Vitals:   01/11/18 1000 01/11/18 1015  BP: 112/65 124/65  Pulse: (!) 48 (!) 47  Resp: 14 10  Temp: (!) 34.5 C (!) 34.6 C  SpO2: 100% 100%    Last Pain:  Vitals:   01/11/18 1000  TempSrc:   PainSc: 0-No pain                 Nolon Nations

## 2018-01-12 ENCOUNTER — Other Ambulatory Visit: Payer: Self-pay

## 2018-01-12 ENCOUNTER — Encounter (HOSPITAL_COMMUNITY): Payer: Self-pay

## 2018-01-12 DIAGNOSIS — M1711 Unilateral primary osteoarthritis, right knee: Secondary | ICD-10-CM | POA: Diagnosis not present

## 2018-01-12 LAB — CBC
HCT: 39.3 % (ref 39.0–52.0)
Hemoglobin: 12.7 g/dL — ABNORMAL LOW (ref 13.0–17.0)
MCH: 30 pg (ref 26.0–34.0)
MCHC: 32.3 g/dL (ref 30.0–36.0)
MCV: 92.7 fL (ref 80.0–100.0)
NRBC: 0 % (ref 0.0–0.2)
PLATELETS: 191 10*3/uL (ref 150–400)
RBC: 4.24 MIL/uL (ref 4.22–5.81)
RDW: 13.4 % (ref 11.5–15.5)
WBC: 12.4 10*3/uL — AB (ref 4.0–10.5)

## 2018-01-12 LAB — BASIC METABOLIC PANEL
Anion gap: 6 (ref 5–15)
BUN: 12 mg/dL (ref 8–23)
CALCIUM: 8.5 mg/dL — AB (ref 8.9–10.3)
CO2: 26 mmol/L (ref 22–32)
CREATININE: 1.07 mg/dL (ref 0.61–1.24)
Chloride: 107 mmol/L (ref 98–111)
GFR calc non Af Amer: >60 mL/min (ref >60)
GLUCOSE: 138 mg/dL — AB (ref 70–99)
Potassium: 4.4 mmol/L (ref 3.5–5.1)
Sodium: 139 mmol/L (ref 135–145)

## 2018-01-12 MED ORDER — METHOCARBAMOL 500 MG PO TABS: 500.0000 mg | ORAL_TABLET | Freq: Four times a day (QID) | ORAL | 0 refills | Status: DC | PRN

## 2018-01-12 MED ORDER — HYDROCODONE-ACETAMINOPHEN 5-325 MG PO TABS: 1.0000 | ORAL_TABLET | Freq: Four times a day (QID) | ORAL | 0 refills | Status: DC | PRN

## 2018-01-12 MED ORDER — ASPIRIN 325 MG PO TBEC: 325.0000 mg | DELAYED_RELEASE_TABLET | Freq: Two times a day (BID) | ORAL | 0 refills | Status: DC

## 2018-01-12 MED ORDER — GABAPENTIN 300 MG PO CAPS: 300.0000 mg | ORAL_CAPSULE | Freq: Three times a day (TID) | ORAL | 0 refills | Status: DC

## 2018-01-12 MED ORDER — TRAMADOL HCL 50 MG PO TABS: 50.0000 mg | ORAL_TABLET | Freq: Four times a day (QID) | ORAL | 0 refills | Status: DC | PRN

## 2018-01-12 NOTE — Progress Notes (Signed)
   Subjective: 1 Day Post-Op Procedure(s) (LRB): RIGHT TOTAL KNEE ARTHROPLASTY (Right) Patient reports pain as moderate.   Patient seen in rounds by Dr. Wynelle Link. Patient is well, endorses moderate levels of pain in the right knee. Foley catheter removed this AM. Denies chest pain, SOB, or calf pain. Did well ambulating with therapy yesterday, will continue working with them today.  Objective: Vital signs in last 24 hours: Temp:  [94.1 F (34.5 C)-97.7 F (36.5 C)] 97.6 F (36.4 C) (10/29 0145) Pulse Rate:  [47-62] 52 (10/29 0541) Resp:  [10-19] 16 (10/28 1824) BP: (101-137)/(61-74) 101/69 (10/29 0541) SpO2:  [96 %-100 %] 97 % (10/29 0541)  Intake/Output from previous day:  Intake/Output Summary (Last 24 hours) at 01/12/2018 0729 Last data filed at 01/12/2018 0600 Gross per 24 hour  Intake 4993.49 ml  Output 3005 ml  Net 1988.49 ml    Labs: Recent Labs    01/12/18 0535  HGB 12.7*   Recent Labs    01/12/18 0535  WBC 12.4*  RBC 4.24  HCT 39.3  PLT 191   Recent Labs    01/12/18 0535  NA 139  K 4.4  CL 107  CO2 26  BUN 12  CREATININE 1.07  GLUCOSE 138*  CALCIUM 8.5*   Exam: General - Patient is Alert and Oriented Extremity - Neurologically intact Neurovascular intact Sensation intact distally Dorsiflexion/Plantar flexion intact Dressing - dressing C/D/I Motor Function - intact, moving foot and toes well on exam.   Past Medical History:  Diagnosis Date  . Angina pectoris ([place]) 2017   ABNORMAL STRESS TEST CATH DONE AT [facility] RESULTS OK  . Cancer ([place]) 2000   Pre-esophogeal  . COPD (chronic obstructive pulmonary disease) ([place])   . Gout   . History of kidney stones   . Hyperlipidemia   . Muscle cramps at night   . Osteoarthrosis    OA  . Raynaud's disease    fingers  . Sleep apnea    NO CPAP USE LAST 6 MONTHS DUE TO IRRITATES NOSE    Assessment/Plan: 1 Day Post-Op Procedure(s) (LRB): RIGHT TOTAL KNEE ARTHROPLASTY (Right) Principal Problem:  OA (osteoarthritis) of knee  Estimated body mass index is 26.5 kg/m as calculated from the following:   Height as of this encounter: 5\' 11"  (1.803 m).   Weight as of this encounter: 86.2 kg. Advance diet Up with therapy D/C IV fluids  Anticipated LOS equal to or greater than 2 midnights due to - Age 14 and older with one or more of the following:  - Obesity  - Expected need for hospital services (PT, OT, Nursing) required for safe  discharge  - Anticipated need for postoperative skilled nursing care or inpatient rehab  - Active co-morbidities: Respiratory Failure/COPD OR   - Unanticipated findings during/Post Surgery: None  - Patient is a high risk of re-admission due to: None    DVT Prophylaxis - Aspirin Weight bearing as tolerated. D/C O2 and pulse ox and try on room air. Hemovac pulled without difficulty, will continue therapy today.  Plan is to go Home after hospital stay. Possible discharge this afternoon if pain is controlled and meeting goals with therapy. Scheduled for outpatient physical therapy at [facility]. Follow-up in the office in 2 weeks.  Theresa Duty, PA-C Orthopedic Surgery 01/12/2018, 7:29 AM

## 2018-01-12 NOTE — Progress Notes (Signed)
   01/12/18 1500  PT Visit Information  Last PT Received On 01/12/18--pt progressing toward goals,reviewed exercises with pt and wife this pm; should be ready for d/c tomorrow; has ramp at home.   Assistance Needed +1  History of Present Illness s/p R TKA  Precautions  Precautions Fall;Knee  Precaution Comments IND SLR today  Required Braces or Orthoses Knee Immobilizer - Right  Knee Immobilizer - Right Discontinue once straight leg raise with < 10 degree lag  Restrictions  Weight Bearing Restrictions No  Other Position/Activity Restrictions WBAT  Pain Assessment  Pain Assessment 0-10  Pain Score 5  Pain Location right knee  Pain Descriptors / Indicators Guarding;Grimacing;Burning  Cognition  Arousal/Alertness Awake/alert  Behavior During Therapy WFL for tasks assessed/performed  Overall Cognitive Status Within Functional Limits for tasks assessed  Bed Mobility  Overal bed mobility Needs Assistance  Bed Mobility Sit to Supine  Sit to supine Supervision  General bed mobility comments for safety  Transfers  Overall transfer level Needs assistance  Equipment used Rolling walker (2 wheeled)  Transfers Sit to/from Stand  Sit to Stand Supervision  General transfer comment cues for hand placement and RLE position, safety  Ambulation/Gait  Ambulation/Gait assistance Supervision  Gait Distance (Feet) 85 Feet  Assistive device Rolling walker (2 wheeled)  Gait Pattern/deviations Step-to pattern;Decreased stance time - right;Antalgic  General Gait Details cues for sequence, RW safety  Total Joint Exercises  Ankle Circles/Pumps AROM;Both;10 reps  Quad Sets Both;AROM;5 reps  Heel Slides AROM;Right;10 reps  Hip ABduction/ADduction AROM;10 reps;Right;AAROM  Straight Leg Raises AROM;Right;10 reps  PT - End of Session  Equipment Utilized During Treatment Gait belt  Activity Tolerance Patient tolerated treatment well  Patient left with call bell/phone within reach;in bed;with  family/visitor present   PT - Assessment/Plan  PT Plan Current plan remains appropriate  PT Visit Diagnosis Difficulty in walking, not elsewhere classified (R26.2)  PT Frequency (ACUTE ONLY) 7X/week  Follow Up Recommendations Follow surgeon's recommendation for DC plan and follow-up therapies  PT equipment None recommended by PT  AM-PAC PT "6 Clicks" Daily Activity Outcome Measure  Difficulty turning over in bed (including adjusting bedclothes, sheets and blankets)? 3  Difficulty moving from lying on back to sitting on the side of the bed?  3  Difficulty sitting down on and standing up from a chair with arms (e.g., wheelchair, bedside commode, etc,.)? 3  Help needed moving to and from a bed to chair (including a wheelchair)? 3  Help needed walking in hospital room? 3  Help needed climbing 3-5 steps with a railing?  3  6 Click Score 18  Mobility G Code  CK  PT Goal Progression  Progress towards PT goals Progressing toward goals  Acute Rehab PT Goals  Time For Goal Achievement 01/18/18  PT Time Calculation  PT Start Time (ACUTE ONLY) 1034  PT Stop Time (ACUTE ONLY) 1103  PT Time Calculation (min) (ACUTE ONLY) 29 min  PT General Charges  $$ ACUTE PT VISIT 1 Visit  PT Treatments  $Gait Training 8-22 mins  $Therapeutic Exercise 8-22 mins

## 2018-01-12 NOTE — Progress Notes (Signed)
Physical Therapy Treatment Patient Details Name: Jordan Roth MRN: [id number] DOB: [birth year] Today's Date: 01/12/2018    History of Present Illness s/p R TKA    PT Comments    Pt progressing toward P{T goals; incr pain today, RN aware--pt prefers to d/c tomorrow; will see again in pm   Follow Up Recommendations  Follow surgeon's recommendation for DC plan and follow-up therapies     Equipment Recommendations  None recommended by PT    Recommendations for Other Services       Precautions / Restrictions Precautions Precautions: Fall;Knee Precaution Comments: IND SLR today Required Braces or Orthoses: Knee Immobilizer - Right Knee Immobilizer - Right: Discontinue once straight leg raise with < 10 degree lag Restrictions Weight Bearing Restrictions: No RLE Weight Bearing: Weight bearing as tolerated Other Position/Activity Restrictions: WBAT    Mobility  Bed Mobility               General bed mobility comments: pt received in chair  Transfers Overall transfer level: Needs assistance Equipment used: Rolling walker (2 wheeled) Transfers: Sit to/from Stand Sit to Stand: Min guard;Supervision         General transfer comment: cues for hand placement and RLE position  Ambulation/Gait Ambulation/Gait assistance: Min Gaffer (Feet): 60 Feet Assistive device: Rolling walker (2 wheeled) Gait Pattern/deviations: Step-to pattern;Decreased stance time - right;Antalgic     General Gait Details: cues for sequence, RW safety   Stairs             Wheelchair Mobility    Modified Rankin (Stroke Patients Only)       Balance                                            Cognition Arousal/Alertness: Awake/alert Behavior During Therapy: WFL for tasks assessed/performed Overall Cognitive Status: Within Functional Limits for tasks assessed                                        Exercises Total  Joint Exercises Ankle Circles/Pumps: AROM;Both;10 reps Quad Sets: Both;AROM;15 reps Heel Slides: AROM;Right;10 reps Hip ABduction/ADduction: AROM;10 reps;Right;AAROM Straight Leg Raises: AROM;Right;10 reps Goniometric ROM: grossly -10* to 65* AAROM    General Comments        Pertinent Vitals/Pain Pain Assessment: 0-10 Pain Score: 5  Pain Location: right knee Pain Descriptors / Indicators: Guarding;Grimacing;Burning Pain Intervention(s): Limited activity within patient's tolerance;Monitored during session;Ice applied    Home Living                      Prior Function            PT Goals (current goals can now be found in the care plan section) Acute Rehab PT Goals Time For Goal Achievement: 01/18/18 Progress towards PT goals: Progressing toward goals    Frequency    7X/week      PT Plan Current plan remains appropriate    Co-evaluation              AM-PAC PT "6 Clicks" Daily Activity  Outcome Measure  Difficulty turning over in bed (including adjusting bedclothes, sheets and blankets)?: A Little Difficulty moving from lying on back to sitting on the side of the bed? : A Little Difficulty sitting down  on and standing up from a chair with arms (e.g., wheelchair, bedside commode, etc,.)?: A Little Help needed moving to and from a bed to chair (including a wheelchair)?: A Little Help needed walking in hospital room?: A Little Help needed climbing 3-5 steps with a railing? : A Little 6 Click Score: 18    End of Session Equipment Utilized During Treatment: Gait belt Activity Tolerance: Patient tolerated treatment well Patient left: in chair;with call bell/phone within reach;with nursing/sitter in room   PT Visit Diagnosis: Difficulty in walking, not elsewhere classified (R26.2)     Time: 1610-9604 PT Time Calculation (min) (ACUTE ONLY): 29 min  Charges:  $Gait Training: 8-22 mins $Therapeutic Exercise: 8-22 mins                     Kenyon Ana, PT  Pager: [phone] Acute Rehab Dept [facility]): [phone]   01/12/2018    [facility] 01/12/2018, 11:34 AM

## 2018-01-12 NOTE — Care Management Note (Signed)
Case Management Note  Patient Details  Name: Jordan Roth MRN: [id number] Date of Birth: 06-13-1955  Subjective/Objective:  Spoke with patient and spouse at bedside. Confirmed plan for OP PT, already arranged. Has RW and 3n1. [phone]                  Action/Plan:   Expected Discharge Date:  01/12/18               Expected Discharge Plan:  OP Rehab  In-House Referral:  NA  Discharge planning Services  NA  Post Acute Care Choice:  NA Choice offered to:  Patient, Spouse  DME Arranged:  N/A DME Agency:  NA  HH Arranged:  NA HH Agency:  NA  Status of Service:  Completed, signed off  If discussed at [place] of Stay Meetings, dates discussed:    Additional Comments:  Guadalupe Maple, RN 01/12/2018, 12:01 PM

## 2018-01-13 DIAGNOSIS — M1711 Unilateral primary osteoarthritis, right knee: Secondary | ICD-10-CM | POA: Diagnosis not present

## 2018-01-13 LAB — CBC
HEMATOCRIT: 37 % — AB (ref 39.0–52.0)
Hemoglobin: 11.9 g/dL — ABNORMAL LOW (ref 13.0–17.0)
MCH: 29.8 pg (ref 26.0–34.0)
MCHC: 32.2 g/dL (ref 30.0–36.0)
MCV: 92.5 fL (ref 80.0–100.0)
Platelets: 202 10*3/uL (ref 150–400)
RBC: 4 MIL/uL — AB (ref 4.22–5.81)
RDW: 13.4 % (ref 11.5–15.5)
WBC: 12.8 10*3/uL — ABNORMAL HIGH (ref 4.0–10.5)
nRBC: 0 % (ref 0.0–0.2)

## 2018-01-13 LAB — BASIC METABOLIC PANEL
Anion gap: 5 (ref 5–15)
BUN: 14 mg/dL (ref 8–23)
CO2: 27 mmol/L (ref 22–32)
Calcium: 8.4 mg/dL — ABNORMAL LOW (ref 8.9–10.3)
Chloride: 106 mmol/L (ref 98–111)
Creatinine, Ser: 1.05 mg/dL (ref 0.61–1.24)
GFR calc Af Amer: >60 mL/min (ref >60)
GFR calc non Af Amer: >60 mL/min (ref >60)
GLUCOSE: 115 mg/dL — AB (ref 70–99)
POTASSIUM: 3.9 mmol/L (ref 3.5–5.1)
Sodium: 138 mmol/L (ref 135–145)

## 2018-01-13 NOTE — Plan of Care (Signed)
Plan of care reviewed and discussed with patient and spouse. 

## 2018-01-13 NOTE — Progress Notes (Addendum)
   Subjective: 2 Days Post-Op Procedure(s) (LRB): RIGHT TOTAL KNEE ARTHROPLASTY (Right) Patient reports pain as mild.   Patient seen in rounds with Dr. Wynelle Link. Patient is well, and has had no acute complaints or problems. States he is ready to go home. Denies chest pain, SOB, or calf pain. Voiding without difficulty and positive flatus.  Plan is to go Home after hospital stay.  Objective: Vital signs in last 24 hours: Temp:  [97.7 F (36.5 C)-98 F (36.7 C)] 97.7 F (36.5 C) (10/30 0558) Pulse Rate:  [59-69] 65 (10/30 0558) Resp:  [16-18] 16 (10/29 2200) BP: (114-138)/(61-65) 114/65 (10/30 0558) SpO2:  [96 %-99 %] 96 % (10/30 0558)  Intake/Output from previous day:  Intake/Output Summary (Last 24 hours) at 01/13/2018 0658 Last data filed at 01/13/2018 0636 Gross per 24 hour  Intake 1440 ml  Output 1750 ml  Net -310 ml    Intake/Output this shift: Total I/O In: 240 [P.O.:240] Out: 500 [Urine:500]  Labs: Recent Labs    01/12/18 0535 01/13/18 0527  HGB 12.7* 11.9*   Recent Labs    01/12/18 0535 01/13/18 0527  WBC 12.4* 12.8*  RBC 4.24 4.00*  HCT 39.3 37.0*  PLT 191 202   Recent Labs    01/12/18 0535 01/13/18 0527  NA 139 138  K 4.4 3.9  CL 107 106  CO2 26 27  BUN 12 14  CREATININE 1.07 1.05  GLUCOSE 138* 115*  CALCIUM 8.5* 8.4*   No results for input(s): LABPT, INR in the last 72 hours.  Exam: General - Patient is Alert and Oriented Extremity - Neurologically intact Neurovascular intact Sensation intact distally Dorsiflexion/Plantar flexion intact Dressing/Incision - clean, dry, no drainage Motor Function - intact, moving foot and toes well on exam.   Past Medical History:  Diagnosis Date  . Angina pectoris ([place]) 2017   ABNORMAL STRESS TEST CATH DONE AT [facility] RESULTS OK  . Cancer ([place]) 2000   Pre-esophogeal  . COPD (chronic obstructive pulmonary disease) ([place])   . Gout   . History of kidney stones   . Hyperlipidemia   . Muscle cramps at  night   . Osteoarthrosis    OA  . Raynaud's disease    fingers  . Sleep apnea    NO CPAP USE LAST 6 MONTHS DUE TO IRRITATES NOSE    Assessment/Plan: 2 Days Post-Op Procedure(s) (LRB): RIGHT TOTAL KNEE ARTHROPLASTY (Right) Principal Problem:   OA (osteoarthritis) of knee  Estimated body mass index is 26.5 kg/m as calculated from the following:   Height as of this encounter: 5\' 11"  (1.803 m).   Weight as of this encounter: 86.2 kg. Up with therapy D/C IV fluids  DVT Prophylaxis - Aspirin Weight-bearing as tolerated  Pain is much improved this AM. Plan for discharge to home after one session of therapy this AM. Scheduled for outpatient PT at [facility]. F/u in the office in 2 weeks.   Theresa Duty, PA-C Orthopedic Surgery 01/13/2018, 6:58 AM

## 2018-01-13 NOTE — Progress Notes (Signed)
Physical Therapy Treatment Patient Details Name: Jordan Roth MRN: [id number] DOB: [birth year] Today's Date: 01/13/2018    History of Present Illness s/p R TKA    PT Comments    Pt progressing well with mobility and eager for return home.  Reviewed home therex program with pt and spouse and with written information provided.   Follow Up Recommendations  Follow surgeon's recommendation for DC plan and follow-up therapies     Equipment Recommendations  None recommended by PT    Recommendations for Other Services       Precautions / Restrictions Precautions Precautions: Fall;Knee Precaution Comments: IND SLR today Required Braces or Orthoses: Knee Immobilizer - Right Knee Immobilizer - Right: Discontinue once straight leg raise with < 10 degree lag Restrictions Weight Bearing Restrictions: No RLE Weight Bearing: Weight bearing as tolerated Other Position/Activity Restrictions: WBAT    Mobility  Bed Mobility Overal bed mobility: Needs Assistance Bed Mobility: Supine to Sit     Supine to sit: Supervision     General bed mobility comments: unassisted to EOB  Transfers Overall transfer level: Needs assistance Equipment used: Rolling walker (2 wheeled) Transfers: Sit to/from Stand Sit to Stand: Supervision         General transfer comment: cues for hand placement and RLE position, safety  Ambulation/Gait Ambulation/Gait assistance: Supervision Gait Distance (Feet): 150 Feet Assistive device: Rolling walker (2 wheeled) Gait Pattern/deviations: Step-to pattern;Decreased stance time - right;Antalgic;Step-through pattern Gait velocity: decr   General Gait Details: cues for posture, position from RW and initial sequence   Stairs             Wheelchair Mobility    Modified Rankin (Stroke Patients Only)       Balance Overall balance assessment: Mild deficits observed, not formally tested                                           Cognition Arousal/Alertness: Awake/alert Behavior During Therapy: WFL for tasks assessed/performed Overall Cognitive Status: Within Functional Limits for tasks assessed                                        Exercises Total Joint Exercises Ankle Circles/Pumps: AROM;Both;20 reps;Supine Quad Sets: Both;AROM;10 reps;Supine Heel Slides: AROM;Right;15 reps;Supine Hip ABduction/ADduction: AROM;10 reps;Right;AAROM Straight Leg Raises: AROM;Right;20 reps;Supine Long Arc Quad: Right;10 reps;Seated;AAROM;AROM Knee Flexion: AROM;AAROM;Right;10 reps;Seated Goniometric ROM: AAROM R knee -10 - 80    General Comments        Pertinent Vitals/Pain Pain Assessment: 0-10 Pain Score: 5  Pain Location: right knee Pain Descriptors / Indicators: Guarding;Grimacing;Burning Pain Intervention(s): Limited activity within patient's tolerance;Monitored during session;Premedicated before session;Ice applied    Home Living                      Prior Function            PT Goals (current goals can now be found in the care plan section) Acute Rehab PT Goals Time For Goal Achievement: 01/18/18 Potential to Achieve Goals: Good Progress towards PT goals: Progressing toward goals    Frequency    7X/week      PT Plan Current plan remains appropriate    Co-evaluation              AM-PAC PT "  6 Clicks" Daily Activity  Outcome Measure  Difficulty turning over in bed (including adjusting bedclothes, sheets and blankets)?: A Little Difficulty moving from lying on back to sitting on the side of the bed? : A Little Difficulty sitting down on and standing up from a chair with arms (e.g., wheelchair, bedside commode, etc,.)?: A Little Help needed moving to and from a bed to chair (including a wheelchair)?: None Help needed walking in hospital room?: None Help needed climbing 3-5 steps with a railing? : A Little 6 Click Score: 20    End of Session Equipment Utilized  During Treatment: Gait belt Activity Tolerance: Patient tolerated treatment well Patient left: in chair;with call bell/phone within reach;with family/visitor present Nurse Communication: Mobility status PT Visit Diagnosis: Difficulty in walking, not elsewhere classified (R26.2)     Time: 0825-0909 PT Time Calculation (min) (ACUTE ONLY): 44 min  Charges:  $Gait Training: 8-22 mins $Therapeutic Exercise: 23-37 mins                     [place] Pager [phone] Office [phone] -   Anav Lammert 01/13/2018, 12:11 PM

## 2018-01-15 ENCOUNTER — Ambulatory Visit (HOSPITAL_COMMUNITY): Payer: 59 | Attending: Student

## 2018-01-15 ENCOUNTER — Encounter

## 2018-01-15 ENCOUNTER — Other Ambulatory Visit: Payer: Self-pay

## 2018-01-15 ENCOUNTER — Encounter (HOSPITAL_COMMUNITY): Payer: Self-pay

## 2018-01-15 DIAGNOSIS — M25661 Stiffness of right knee, not elsewhere classified: Secondary | ICD-10-CM

## 2018-01-15 DIAGNOSIS — R262 Difficulty in walking, not elsewhere classified: Secondary | ICD-10-CM | POA: Diagnosis not present

## 2018-01-15 DIAGNOSIS — R600 Localized edema: Secondary | ICD-10-CM | POA: Diagnosis not present

## 2018-01-15 NOTE — Patient Instructions (Signed)
Access Code: [email or web address]  URL: [email or web address]  Date: 01/15/2018  Prepared by: Geraldine Solar   Exercises Supine Quadricep Sets - 10 reps - 3 sets - 1x daily - 7x weekly Supine Heel Slide with Strap - 10 reps - 3 sets - 1x daily - 7x weekly Seated Hamstring Stretch - 10 reps - 3 sets - 1x daily - 7x weekly

## 2018-01-15 NOTE — Therapy (Signed)
[place], [place], [address] Phone: [phone]   Fax:  [phone]  Physical Therapy Evaluation  Patient Details  Name: Jordan Roth MRN: [id number] Date of Birth: [birth year] Referring Provider (PT): Theresa Duty, PA-C (Surgeon: Gaynelle Arabian, MD)   Encounter Date: 01/15/2018  PT End of Session - 01/15/18 1626    Visit Number  1    Number of Visits  19    Date for PT Re-Evaluation  02/26/18   mini reassess on 02/05/18   Authorization Type  [employer]    Authorization Time Period  01/15/18 to 02/26/18    Authorization - Visit Number  1    Authorization - Number of Visits  60   PT/OT/SLP combined   PT Start Time  1341    PT Stop Time  1425    PT Time Calculation (min)  44 min    Activity Tolerance  Patient tolerated treatment well    Behavior During Therapy  [facility] for tasks assessed/performed       Past Medical History:  Diagnosis Date  . Angina pectoris ([place]) 2017   ABNORMAL STRESS TEST CATH DONE AT [facility] RESULTS OK  . Cancer ([place]) 2000   Pre-esophogeal  . COPD (chronic obstructive pulmonary disease) ([place])   . Gout   . History of kidney stones   . Hyperlipidemia   . Muscle cramps at night   . Osteoarthrosis    OA  . Raynaud's disease    fingers  . Sleep apnea    NO CPAP USE LAST 6 MONTHS DUE TO IRRITATES NOSE    Past Surgical History:  Procedure Laterality Date  . CARDIAC CATHETERIZATION  2017 [facility]  . COLONOSCOPY    . surgery for barretts esophagus  2000  . surgery for kidney stone  2009 or 2010  . TOTAL KNEE ARTHROPLASTY Right 01/11/2018   Procedure: RIGHT TOTAL KNEE ARTHROPLASTY;  Surgeon: Gaynelle Arabian, MD;  Location: [facility];  Service: Orthopedics;  Laterality: Right;    There were no vitals filed for this visit.   Subjective Assessment - 01/15/18 1348    Subjective  Pt underwent R TKA on 01/11/18 by Dr. Gaynelle Arabian. He states that bone on bone and pain is what led to  his surgery. Pt states that he is mainly having difficulty bending his knee. He is currently using a RW for ambulation but prior to his surgery he was not using an AD. His pain is well-managed at this point.     Limitations  House hold activities;Walking;Standing;Sitting    How long can you sit comfortably?  >1 hour    How long can you stand comfortably?  pretty good ways as long as he has something to hold to    How long can you walk comfortably?  10 mins comfortable    Patient Stated Goals  get back to like I was    Currently in Pain?  Yes    Pain Score  3     Pain Location  Knee    Pain Orientation  Right    Pain Descriptors / Indicators  Aching    Pain Type  Surgical pain    Pain Onset  In the past 7 days    Pain Frequency  Intermittent    Aggravating Factors   nothing really    Pain Relieving Factors  nothing really    Effect of Pain on Daily Activities  increases  [facility] PT Assessment - 01/15/18 0001      Assessment   Medical Diagnosis  R TKA    Referring Provider (PT)  Theresa Duty, PA-C   Surgeon: Gaynelle Arabian, MD   Onset Date/Surgical Date  01/11/18    Next MD Visit  01/25/18    Prior Therapy  none      Balance Screen   Has the patient fallen in the past 6 months  Yes    How many times?  3-4   working on roof, stepping in holes on ground   Has the patient had a decrease in activity level because of a fear of falling?   No    Is the patient reluctant to leave their home because of a fear of falling?   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 [employer]    Leisure  play golf, basketball with grandson      Observation/Other Assessments   Focus on Therapeutic Outcomes (FOTO)   to be completed next visit      Observation/Other Assessments-Edema    Edema  Circumferential      Circumferential Edema   Circumferential - Right  40cm, joint line     Circumferential - Left   33cm, jiont line      ROM / Strength   AROM / PROM / Strength  AROM;Strength      AROM   AROM Assessment Site  Knee    Right/Left Knee  Right    Right Knee Extension  15    Right Knee Flexion  75   83deg AAROM with LLE     Strength   Strength Assessment Site  Hip;Knee;Ankle    Right Hip Flexion  4/5    Right Hip Extension  4+/5    Right Hip ABduction  4+/5    Left Hip Flexion  4+/5    Left Hip Extension  4/5    Left Hip ABduction  4+/5    Right Knee Flexion  4+/5   through available range   Right Knee Extension  4/5    Left Knee Flexion  5/5    Left Knee Extension  5/5    Right Ankle Dorsiflexion  5/5    Left Ankle Dorsiflexion  5/5      Palpation   Patella mobility  hypomobile throughout    Palpation comment  increased       Ambulation/Gait   Ambulation Distance (Feet)  306 Feet   3MWT   Assistive device  Rolling walker      Balance   Balance Assessed  Yes      Static Standing Balance   Static Standing - Balance Support  No upper extremity supported    Static Standing Balance -  Activities   Single Leg Stance - Right Leg;Single Leg Stance - Left Leg    Static Standing - Comment/# of Minutes  R: 3sec or < L: 20sec      Standardized Balance Assessment   Standardized Balance Assessment  Five Times Sit to Stand    Five times sit to stand comments   23.4sec, BUE support, RLE extended            Objective measurements completed on examination: See above findings.         PT Education - 01/15/18 1626    Education Details  exam findings,  POC, HE    Person(s) Educated  Patient    Methods  Explanation;Demonstration;Handout    Comprehension  Verbalized understanding;Returned demonstration       PT Short Term Goals - 01/15/18 1634      PT SHORT TERM GOAL #1   Title  Pt will have reduced R knee joint line edema by 3cm or > in order to improved ROM and reduce pain.    Time  3    Period  Weeks    Status  New    Target Date   02/05/18      PT SHORT TERM GOAL #2   Title  Pt will have improved R knee AROM from 8-100deg in order to maximize gait.     Time  3    Period  Weeks    Status  New      PT SHORT TERM GOAL #3   Title  Pt will be able to perform R SLS for 10 sec without UE support to maximize gait and demo improved functional strength.    Time  3    Period  Weeks    Status  New      PT SHORT TERM GOAL #4   Title  Pt will be able to perform 5xSTS in 12sec or < without UE and with proper form to demo improved balance and functional strength.     Time  3    Period  Weeks    Status  New        PT Long Term Goals - 01/15/18 1635      PT LONG TERM GOAL #1   Title  Pt will have improved R knee AROM from 0-120deg in order to further maximize gait and improve stair ambulation.    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    Time  6    Period  Weeks    Status  New      PT LONG TERM GOAL #3   Title  Pt will ambulate at least 653ft without AD and gait WNL in order to demo improved endurance and strength to allow him to maximize his community access and return to PLOF.     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     Time  6    Period  Weeks    Status  New             Plan - 01/15/18 1627    Clinical Impression Statement  Pt is pleasant 62YO M who presents to therapy 4 days post-op from R TKA by Dr. Gaynelle Arabian. He presents with post-op deficits in edema, ROM, MMT, balance, gait, functional strength, and functional mobility. Pt noted to have approximately 7cm of  swelling at joint line compared to the L knee. His AROM was 15-75 with 83deg AAROM flexion.  Pt needs skilled PT intervention to address these impairments in order to reduce edema, improve ROM and strength, and promote return to PLOF.     Clinical Presentation  Stable    Clinical Presentation due to:  see flowsheets for objective tests and measures    Clinical Decision Making  Low    Rehab Potential  Good    PT Frequency  3x / week    PT Duration  6 weeks    PT Treatment/Interventions  ADLs/Self Care Home Management;Aquatic Therapy;Cryotherapy;Electrical Stimulation;Moist Heat;Ultrasound;DME Instruction;Gait training;Stair training;Functional mobility training;Therapeutic activities;Therapeutic exercise;Balance training;Neuromuscular re-education;Patient/family education;Manual techniques;Orthotic Fit/Training;Scar mobilization;Passive range of motion;Dry needling;Energy conservation;Taping    PT Next Visit Plan  review goals; administer FOTO; focus on ROM and edema prior to strengthening until AROM normalized    PT Home Exercise Plan  eval: quad sets, heel slides, seated HS stretch    Consulted and Agree with Plan of Care  Patient       Patient will benefit from skilled therapeutic intervention in order to improve the following deficits and impairments:  Abnormal gait, Decreased activity tolerance, Decreased balance, Decreased endurance, Decreased mobility, Decreased range of motion, Decreased scar mobility, Decreased strength, Difficulty walking, Hypomobility, Increased edema, Increased fascial restricitons, Increased muscle spasms, Impaired flexibility, Pain  Visit Diagnosis: Stiffness of right knee, not elsewhere classified - Plan: PT plan of care cert/re-cert  Localized edema - Plan: PT plan of care cert/re-cert  Difficulty in walking, not elsewhere classified - Plan: PT plan of care cert/re-cert     Problem List Patient Active Problem List   Diagnosis Date Noted  . OA  (osteoarthritis) of knee 01/11/2018  . Gout 12/20/2015  . COPD (chronic obstructive pulmonary disease) ([place]) 12/20/2015  . Hyperlipidemia 12/20/2015  . Sleep apnea 12/20/2015  . Angina pectoris ([place]) 12/20/2015        Geraldine Solar PT, DPT  [place] [address] [place], [place], [address] Phone: [phone]   Fax:  [phone]  Name: Jordan Roth MRN: [id number] Date of Birth: 04-Sep-1955

## 2018-01-18 ENCOUNTER — Encounter (HOSPITAL_COMMUNITY): Payer: Self-pay

## 2018-01-18 ENCOUNTER — Ambulatory Visit (HOSPITAL_COMMUNITY): Payer: 59

## 2018-01-18 DIAGNOSIS — M25661 Stiffness of right knee, not elsewhere classified: Secondary | ICD-10-CM | POA: Diagnosis not present

## 2018-01-18 DIAGNOSIS — R262 Difficulty in walking, not elsewhere classified: Secondary | ICD-10-CM

## 2018-01-18 DIAGNOSIS — R600 Localized edema: Secondary | ICD-10-CM

## 2018-01-18 NOTE — Therapy (Signed)
[place], [place], [address] Phone: [phone]   Fax:  [phone]  Physical Therapy Treatment  Patient Details  Name: Jordan Roth MRN: [id number] Date of Birth: 1955/08/17 Referring Provider (PT): Theresa Duty, PA-C (Surgeon: Gaynelle Arabian, MD)   Encounter Date: 01/18/2018  PT End of Session - 01/18/18 1429    Visit Number  2    Number of Visits  19    Date for PT Re-Evaluation  02/26/18   mini reassess on 02/05/18   Authorization Type  [employer]    Authorization Time Period  01/15/18 to 02/26/18    Authorization - Visit Number  2    Authorization - Number of Visits  60   PT/OT/SLP combined   PT Start Time  1430    PT Stop Time  1515    PT Time Calculation (min)  45 min    Activity Tolerance  Patient tolerated treatment well    Behavior During Therapy  [facility] for tasks assessed/performed       Past Medical History:  Diagnosis Date  . Angina pectoris ([place]) 2017   ABNORMAL STRESS TEST CATH DONE AT [facility] RESULTS OK  . Cancer ([place]) 2000   Pre-esophogeal  . COPD (chronic obstructive pulmonary disease) ([place])   . Gout   . History of kidney stones   . Hyperlipidemia   . Muscle cramps at night   . Osteoarthrosis    OA  . Raynaud's disease    fingers  . Sleep apnea    NO CPAP USE LAST 6 MONTHS DUE TO IRRITATES NOSE    Past Surgical History:  Procedure Laterality Date  . CARDIAC CATHETERIZATION  2017 [facility]  . COLONOSCOPY    . surgery for barretts esophagus  2000  . surgery for kidney stone  2009 or 2010  . TOTAL KNEE ARTHROPLASTY Right 01/11/2018   Procedure: RIGHT TOTAL KNEE ARTHROPLASTY;  Surgeon: Gaynelle Arabian, MD;  Location: [facility];  Service: Orthopedics;  Laterality: Right;    There were no vitals filed for this visit.  Subjective Assessment - 01/18/18 1429    Subjective  Pt states that he's doing well. He reports his swelling reduced some over the weekend but it is a little more  swollen today.     Limitations  House hold activities;Walking;Standing;Sitting    How long can you sit comfortably?  >1 hour    How long can you stand comfortably?  pretty good ways as long as he has something to hold to    How long can you walk comfortably?  10 mins comfortable    Patient Stated Goals  get back to like I was    Currently in Pain?  Yes    Pain Score  3     Pain Location  Knee    Pain Orientation  Right    Pain Descriptors / Indicators  Aching    Pain Type  Surgical pain    Pain Onset  In the past 7 days    Pain Frequency  Intermittent    Aggravating Factors   nothing really    Pain Relieving Factors  nothing really    Effect of Pain on Daily Activities  increases            [facility] Adult PT Treatment/Exercise - 01/18/18 0001      Ambulation/Gait   Ambulation Distance (Feet)  452 Feet    Assistive device  Rolling walker  Gait Comments  decr heel strike and R foot/hip ER      Exercises   Exercises  Knee/Hip      Knee/Hip Exercises: Stretches   Passive Hamstring Stretch  Right;3 reps;30 seconds    Passive Hamstring Stretch Limitations  supine with rope      Knee/Hip Exercises: Supine   Quad Sets  Right;10 reps    Quad Sets Limitations  5"  holds    Short Arc [employer]  Right;10 reps    Short Arc Quad Sets Limitations  3-5" holds    Heel Slides  Right;10 reps    Heel Slides Limitations  3-5" holds at end range    Straight Leg Raises  Right;10 reps    Straight Leg Raises Limitations  quad set prior    Knee Extension Limitations  13    Knee Flexion Limitations  83      Manual Therapy   Manual Therapy  Edema management    Manual therapy comments  completed separate rest of treatment    Edema Management  retro massage with BLE elevated to reduce edema             PT Education - 01/18/18 1605    Education Details  exercise technique, contiue HEP    Person(s) Educated  Patient    Methods  Explanation;Demonstration    Comprehension  Verbalized  understanding;Returned demonstration       PT Short Term Goals - 01/15/18 1634      PT SHORT TERM GOAL #1   Title  Pt will have reduced R knee joint line edema by 3cm or > in order to improved ROM and reduce pain.    Time  3    Period  Weeks    Status  New    Target Date  02/05/18      PT SHORT TERM GOAL #2   Title  Pt will have improved R knee AROM from 8-100deg in order to maximize gait.     Time  3    Period  Weeks    Status  New      PT SHORT TERM GOAL #3   Title  Pt will be able to perform R SLS for 10 sec without UE support to maximize gait and demo improved functional strength.    Time  3    Period  Weeks    Status  New      PT SHORT TERM GOAL #4   Title  Pt will be able to perform 5xSTS in 12sec or < without UE and with proper form to demo improved balance and functional strength.     Time  3    Period  Weeks    Status  New        PT Long Term Goals - 01/15/18 1635      PT LONG TERM GOAL #1   Title  Pt will have improved R knee AROM from 0-120deg in order to further maximize gait and improve stair ambulation.    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    Time  6    Period  Weeks    Status  New      PT LONG TERM GOAL #3   Title  Pt will ambulate at least 661ft without AD and gait WNL in order to demo improved endurance and strength to allow him to maximize his community access and return to PLOF.     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     Time  6    Period  Weeks    Status  New            Plan - 01/18/18 1605     Clinical Impression Statement  Focused mainly on AROM and edema control today. Pt reporting that he had increased difficulty with the heel slides at home due to pain and felt like he should be able to do them better; PT assessed his form with the exercise and it was fine, PT just assured him that due to him being 7days post-op, knee flexion is going to be difficulty for a while and he verbalized understanding. Gait training with RW also performed and pt required cues for heel strike and to decrease hip/foot ER. Ended with manual for edema with BLE elevated. AROM 13-83deg this date, was 15-75 at initial eval. Continue as planned, progressing as able    Rehab Potential  Good    PT Frequency  3x / week    PT Duration  6 weeks    PT Treatment/Interventions  ADLs/Self Care Home Management;Aquatic Therapy;Cryotherapy;Electrical Stimulation;Moist Heat;Ultrasound;DME Instruction;Gait training;Stair training;Functional mobility training;Therapeutic activities;Therapeutic exercise;Balance training;Neuromuscular re-education;Patient/family education;Manual techniques;Orthotic Fit/Training;Scar mobilization;Passive range of motion;Dry needling;Energy conservation;Taping    PT Next Visit Plan  review goals and administer FOTO as this PT did not perform on 11/4; continue focus on ROM and edema prior to strengthening until AROM normalized    PT Home Exercise Plan  eval: quad sets, heel slides, seated HS stretch    Consulted and Agree with Plan of Care  Patient       Patient will benefit from skilled therapeutic intervention in order to improve the following deficits and impairments:  Abnormal gait, Decreased activity tolerance, Decreased balance, Decreased endurance, Decreased mobility, Decreased range of motion, Decreased scar mobility, Decreased strength, Difficulty walking, Hypomobility, Increased edema, Increased fascial restricitons, Increased muscle spasms, Impaired flexibility, Pain  Visit Diagnosis: Stiffness  of right knee, not elsewhere classified  Localized edema  Difficulty in walking, not elsewhere classified     Problem List Patient Active Problem List   Diagnosis Date Noted  . OA (osteoarthritis) of knee 01/11/2018  . Gout 12/20/2015  . COPD (chronic obstructive pulmonary disease) ([place]) 12/20/2015  . Hyperlipidemia 12/20/2015  . Sleep apnea 12/20/2015  . Angina pectoris ([place]) 12/20/2015        Geraldine Solar PT, DPT  [place] [address] [place], [place], [address] Phone: [phone]   Fax:  [phone]  Name: Jordan Roth MRN: [id number] Date of Birth: Jul 27, 1955

## 2018-01-20 ENCOUNTER — Ambulatory Visit (HOSPITAL_COMMUNITY): Payer: 59 | Admitting: Physical Therapy

## 2018-01-20 DIAGNOSIS — M25661 Stiffness of right knee, not elsewhere classified: Secondary | ICD-10-CM

## 2018-01-20 DIAGNOSIS — R262 Difficulty in walking, not elsewhere classified: Secondary | ICD-10-CM

## 2018-01-20 DIAGNOSIS — R600 Localized edema: Secondary | ICD-10-CM

## 2018-01-20 NOTE — Therapy (Signed)
[place], [place], [address] Phone: [phone]   Fax:  [phone]  Physical Therapy Treatment  Patient Details  Name: Jordan Roth MRN: [id number] Date of Birth: 06-Aug-1955 Referring Provider (PT): Theresa Duty, PA-C (Surgeon: Gaynelle Arabian, MD)   Encounter Date: 01/20/2018  PT End of Session - 01/20/18 1407    Visit Number  3    Number of Visits  19    Date for PT Re-Evaluation  02/26/18   mini reassess on 02/05/18   Authorization Type  [employer]    Authorization Time Period  01/15/18 to 02/26/18    Authorization - Visit Number  3    Authorization - Number of Visits  60   PT/OT/SLP combined   PT Start Time  1304    PT Stop Time  1354    PT Time Calculation (min)  50 min    Activity Tolerance  Patient tolerated treatment well    Behavior During Therapy  [facility] for tasks assessed/performed       Past Medical History:  Diagnosis Date  . Angina pectoris ([place]) 2017   ABNORMAL STRESS TEST CATH DONE AT [facility] RESULTS OK  . Cancer ([place]) 2000   Pre-esophogeal  . COPD (chronic obstructive pulmonary disease) ([place])   . Gout   . History of kidney stones   . Hyperlipidemia   . Muscle cramps at night   . Osteoarthrosis    OA  . Raynaud's disease    fingers  . Sleep apnea    NO CPAP USE LAST 6 MONTHS DUE TO IRRITATES NOSE    Past Surgical History:  Procedure Laterality Date  . CARDIAC CATHETERIZATION  2017 [facility]  . COLONOSCOPY    . surgery for barretts esophagus  2000  . surgery for kidney stone  2009 or 2010  . TOTAL KNEE ARTHROPLASTY Right 01/11/2018   Procedure: RIGHT TOTAL KNEE ARTHROPLASTY;  Surgeon: Gaynelle Arabian, MD;  Location: [facility];  Service: Orthopedics;  Laterality: Right;    There were no vitals filed for this visit.  Subjective Assessment - 01/20/18 1425    Subjective  Pt states he is doing his exercises, having most difficulty with heelslides.     Currently in Pain?  Yes    Pain Score  3     Pain Location  Knee    Pain Orientation  Right    Pain Descriptors / Indicators  Aching    Pain Type  Surgical pain                       [facility] Adult PT Treatment/Exercise - 01/20/18 0001      Ambulation/Gait   Gait Comments  worked on heel-toe gait as tends to walk on Rt forefoot      Exercises   Exercises  Knee/Hip      Knee/Hip Exercises: Stretches   Passive Hamstring Stretch  Right;20 seconds;2 reps;Limitations    Passive Hamstring Stretch Limitations  using 12" step    Knee: Self-Stretch to increase Flexion  Right;10 seconds;Limitations    Knee: Self-Stretch Limitations  5 reps on 12" step    Gastroc Stretch  Right;2 reps;20 seconds    Gastroc Stretch Limitations  pushing heel down from step      Knee/Hip Exercises: Standing   Heel Raises  Both;10 reps;Limitations    Heel Raises Limitations  toeraises 10 reps    Knee Flexion  Right;10 reps  Knee/Hip Exercises: Seated   Long Arc Quad  Right;10 reps    Heel Slides  Right    Heel Slides Limitations  using towel      Knee/Hip Exercises: Supine   Quad Sets  Right;10 reps    Quad Sets Limitations  5" holds    Short Arc Quad Sets  Right;10 reps    Short Arc Quad Sets Limitations  5" holds    Heel Slides  Right;10 reps    Heel Slides Limitations  3-5" holds at end range    Knee Extension Limitations  12    Knee Flexion Limitations  83-87 with AA      Manual Therapy   Manual Therapy  Edema management;Myofascial release    Manual therapy comments  completed separate rest of treatment    Edema Management  retro massage with BLE elevated to reduce edema    Myofascial Release  to decrease adhesions perimeter of knee               PT Short Term Goals - 01/15/18 1634      PT SHORT TERM GOAL #1   Title  Pt will have reduced R knee joint line edema by 3cm or > in order to improved ROM and reduce pain.    Time  3    Period  Weeks    Status  New    Target Date  02/05/18       PT SHORT TERM GOAL #2   Title  Pt will have improved R knee AROM from 8-100deg in order to maximize gait.     Time  3    Period  Weeks    Status  New      PT SHORT TERM GOAL #3   Title  Pt will be able to perform R SLS for 10 sec without UE support to maximize gait and demo improved functional strength.    Time  3    Period  Weeks    Status  New      PT SHORT TERM GOAL #4   Title  Pt will be able to perform 5xSTS in 12sec or < without UE and with proper form to demo improved balance and functional strength.     Time  3    Period  Weeks    Status  New        PT Long Term Goals - 01/15/18 1635      PT LONG TERM GOAL #1   Title  Pt will have improved R knee AROM from 0-120deg in order to further maximize gait and improve stair ambulation.    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    Time  6    Period  Weeks    Status  New      PT LONG TERM GOAL #3   Title  Pt will ambulate at least 642ft without AD and gait WNL in order to demo improved endurance and strength to allow him to maximize his community access and return to PLOF.     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     Time  6    Period  Weeks    Status  New            Plan - 01/20/18 1407    Clinical Impression Statement  Continued with focus on improving AROM using stretching, manual and other therepeutic exercises.  Began myofascial techniques for tight adhesions perimeter of knee, noted tightness in posterior knee, proximal calf mm.  Instructed with standing calf stretch, however unable  to keep heep down on floor with tendency to bend knee to work on flexion.  Instructed on step, like plantar fascia stretch, and patient able to complete with improved form and stretch felt.  Pt with improved ROM, active for extension to 12 degrees and AAROM to 87 degrees for flexion.  Began standing stretches as well this session with cues to hold stretches longer.  Worked on improving heel-toe giat when ambulating from facility.      Rehab Potential  Good    PT Frequency  3x / week    PT Duration  6 weeks    PT Treatment/Interventions  ADLs/Self Care Home Management;Aquatic Therapy;Cryotherapy;Electrical Stimulation;Moist Heat;Ultrasound;DME Instruction;Gait training;Stair training;Functional mobility training;Therapeutic activities;Therapeutic exercise;Balance training;Neuromuscular re-education;Patient/family education;Manual techniques;Orthotic Fit/Training;Scar mobilization;Passive range of motion;Dry needling;Energy conservation;Taping    PT Next Visit Plan  review goals and administer FOTO as this PT did not perform on 11/4; continue focus on ROM and edema prior to strengthening until AROM normalized    PT Home Exercise Plan  eval: quad sets, heel slides, seated HS stretch    Consulted and Agree with Plan of Care  Patient       Patient will benefit from skilled therapeutic intervention in order to improve the following deficits and impairments:  Abnormal gait, Decreased activity tolerance, Decreased balance, Decreased endurance, Decreased mobility, Decreased range of motion, Decreased scar mobility, Decreased strength, Difficulty walking, Hypomobility, Increased edema, Increased fascial restricitons, Increased muscle spasms, Impaired flexibility, Pain  Visit Diagnosis: Stiffness of right knee, not elsewhere classified  Localized edema  Difficulty in walking, not elsewhere classified     Problem List Patient Active Problem List   Diagnosis Date Noted  . OA (osteoarthritis) of knee  01/11/2018  . Gout 12/20/2015  . COPD (chronic obstructive pulmonary disease) ([place]) 12/20/2015  . Hyperlipidemia 12/20/2015  . Sleep apnea 12/20/2015  . Angina pectoris ([place]) 12/20/2015   Teena Irani, PTA/CLT [phone]  Teena Irani 01/20/2018, 2:27 PM  [place] [address] [place], [place], [address] Phone: [phone]   Fax:  [phone]  Name: Jordan Roth MRN: [id number] Date of Birth: 12/08/1955

## 2018-01-22 ENCOUNTER — Ambulatory Visit (HOSPITAL_COMMUNITY): Payer: 59 | Admitting: Physical Therapy

## 2018-01-22 ENCOUNTER — Encounter (HOSPITAL_COMMUNITY): Payer: Self-pay | Admitting: Physical Therapy

## 2018-01-22 DIAGNOSIS — R600 Localized edema: Secondary | ICD-10-CM

## 2018-01-22 DIAGNOSIS — M25661 Stiffness of right knee, not elsewhere classified: Secondary | ICD-10-CM | POA: Diagnosis not present

## 2018-01-22 DIAGNOSIS — R262 Difficulty in walking, not elsewhere classified: Secondary | ICD-10-CM

## 2018-01-22 NOTE — Therapy (Addendum)
[place], [place], [address] Phone: [phone]   Fax:  [phone]  Physical Therapy Treatment  Patient Details  Name: Jordan Roth MRN: [id number] Date of Birth: 27-Jul-1955 Referring Provider (PT): Theresa Duty, PA-C (Surgeon: Gaynelle Arabian, MD)   Encounter Date: 01/22/2018  PT End of Session - 01/22/18 1305    Visit Number  4    Number of Visits  19    Date for PT Re-Evaluation  02/26/18   mini reassess on 02/05/18   Authorization Type  [employer]    Authorization Time Period  01/15/18 to 02/26/18    Authorization - Visit Number  4    Authorization - Number of Visits  60   PT/OT/SLP combined   PT Start Time  1300    PT Stop Time  5456    PT Time Calculation (min)  47 min    Activity Tolerance  Patient tolerated treatment well    Behavior During Therapy  [facility] for tasks assessed/performed       Past Medical History:  Diagnosis Date  . Angina pectoris ([place]) 2017   ABNORMAL STRESS TEST CATH DONE AT [facility] RESULTS OK  . Cancer ([place]) 2000   Pre-esophogeal  . COPD (chronic obstructive pulmonary disease) ([place])   . Gout   . History of kidney stones   . Hyperlipidemia   . Muscle cramps at night   . Osteoarthrosis    OA  . Raynaud's disease    fingers  . Sleep apnea    NO CPAP USE LAST 6 MONTHS DUE TO IRRITATES NOSE    Past Surgical History:  Procedure Laterality Date  . CARDIAC CATHETERIZATION  2017 [facility]  . COLONOSCOPY    . surgery for barretts esophagus  2000  . surgery for kidney stone  2009 or 2010  . TOTAL KNEE ARTHROPLASTY Right 01/11/2018   Procedure: RIGHT TOTAL KNEE ARTHROPLASTY;  Surgeon: Gaynelle Arabian, MD;  Location: [facility];  Service: Orthopedics;  Laterality: Right;    There were no vitals filed for this visit.  Subjective Assessment - 01/22/18 1303    Subjective  Patient stated that he is doing well and has been doing his exercises at home. He said he was sore after last  session.     Currently in Pain?  No/denies                       [facility] Adult PT Treatment/Exercise - 01/22/18 0001      Exercises   Exercises  Knee/Hip      Knee/Hip Exercises: Stretches   Passive Hamstring Stretch  Right;20 seconds;Limitations;3 reps    Passive Hamstring Stretch Limitations  using 12" step    Knee: Self-Stretch to increase Flexion  Right;10 seconds;Limitations    Knee: Self-Stretch Limitations  10 reps on 12" step    Gastroc Stretch  Right;3 reps;30 seconds    Gastroc Stretch Limitations  pushing heel down from step      Knee/Hip Exercises: Standing   Heel Raises  Both;10 reps;Limitations    Heel Raises Limitations  toeraises 10 reps    Knee Flexion  Right;10 reps    Rocker Board  2 minutes    Rocker Board Limitations  Lateral      Knee/Hip Exercises: Seated   Long Arc Quad  Right;10 reps    Heel Slides  Right;10 reps;1 set    Heel Slides Limitations  using towel  Knee/Hip Exercises: Supine   Quad Sets  Right;10 reps    [employer] Limitations  5'' holds    Short Arc [employer]  Right;10 reps    Short Arc Quad Sets Limitations  5'' holds    Heel Slides  Right;10 reps    Heel Slides Limitations  3-5" holds at end range    Knee Extension  AROM    Knee Extension Limitations  10    Knee Flexion  AROM    Knee Flexion Limitations  89      Manual Therapy   Manual Therapy  Edema management;Myofascial release    Manual therapy comments  completed separate rest of treatment    Edema Management  retro massage with BLE elevated to reduce edema    Myofascial Release  to decrease adhesions perimeter of knee             PT Education - 01/22/18 1304    Education Details  Educated patient on purpose and technique of exercises throughout.     Person(s) Educated  Patient    Methods  Explanation;Demonstration    Comprehension  Verbalized understanding       PT Short Term Goals - 01/15/18 1634      PT SHORT TERM GOAL #1   Title  Pt will  have reduced R knee joint line edema by 3cm or > in order to improved ROM and reduce pain.    Time  3    Period  Weeks    Status  New    Target Date  02/05/18      PT SHORT TERM GOAL #2   Title  Pt will have improved R knee AROM from 8-100deg in order to maximize gait.     Time  3    Period  Weeks    Status  New      PT SHORT TERM GOAL #3   Title  Pt will be able to perform R SLS for 10 sec without UE support to maximize gait and demo improved functional strength.    Time  3    Period  Weeks    Status  New      PT SHORT TERM GOAL #4   Title  Pt will be able to perform 5xSTS in 12sec or < without UE and with proper form to demo improved balance and functional strength.     Time  3    Period  Weeks    Status  New        PT Long Term Goals - 01/15/18 1635      PT LONG TERM GOAL #1   Title  Pt will have improved R knee AROM from 0-120deg in order to further maximize gait and improve stair ambulation.    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    Time  6    Period  Weeks    Status  New      PT LONG TERM GOAL #3   Title  Pt will ambulate at least 644ft without AD and gait WNL in order to demo improved endurance and strength to allow him to maximize his community access and  return to PLOF.     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     Time  6    Period  Weeks    Status  New            Plan - 01/22/18 1333    Clinical Impression Statement  This session continued with established plan of care. This session progressed patient with increased  length of time during muscle stretches. This session also added rockerboard for improved ROM. This session patient's right knee AROM ranged from 10 degrees extension to 89 degrees flexion. Plan to continue with focus on improving patient's knee AROM.     Rehab Potential  Good    PT Frequency  3x / week    PT Duration  6 weeks    PT Treatment/Interventions  ADLs/Self Care Home Management;Aquatic Therapy;Cryotherapy;Electrical Stimulation;Moist Heat;Ultrasound;DME Instruction;Gait training;Stair training;Functional mobility training;Therapeutic activities;Therapeutic exercise;Balance training;Neuromuscular re-education;Patient/family education;Manual techniques;Orthotic Fit/Training;Scar mobilization;Passive range of motion;Dry needling;Energy conservation;Taping    PT Next Visit Plan  Complete FOTO. continue focus on ROM and edema prior to strengthening until AROM normalized. COnsider initiating bike without resistance with rocking as able.    PT Home Exercise Plan  eval: quad sets, heel slides, seated HS stretch    Consulted and Agree with Plan of Care  Patient       Patient will benefit from skilled therapeutic intervention in order to improve the following deficits and impairments:  Abnormal gait, Decreased activity tolerance, Decreased balance, Decreased endurance, Decreased mobility, Decreased range of motion, Decreased scar mobility, Decreased strength, Difficulty walking, Hypomobility, Increased edema, Increased fascial restricitons, Increased muscle spasms, Impaired flexibility, Pain  Visit Diagnosis: Stiffness of right knee, not elsewhere classified  Localized edema  Difficulty in walking, not elsewhere classified     Problem List Patient Active Problem List   Diagnosis Date Noted  . OA (osteoarthritis) of knee 01/11/2018  . Gout 12/20/2015  . COPD (chronic obstructive pulmonary disease) ([place]) 12/20/2015  . Hyperlipidemia 12/20/2015  . Sleep apnea 12/20/2015  . Angina pectoris  ([place]) 12/20/2015   Clarene Critchley PT, DPT 1:52 PM, 01/22/18 [place] [address] [place], [place], [address] Phone: [phone]   Fax:  [phone]  Name: FYNN VANBLARCOM MRN: [id number] Date of Birth: December 16, 1955

## 2018-01-25 ENCOUNTER — Ambulatory Visit (HOSPITAL_COMMUNITY): Payer: 59 | Admitting: Physical Therapy

## 2018-01-25 ENCOUNTER — Encounter (HOSPITAL_COMMUNITY): Payer: Self-pay | Admitting: Physical Therapy

## 2018-01-25 DIAGNOSIS — R600 Localized edema: Secondary | ICD-10-CM

## 2018-01-25 DIAGNOSIS — R262 Difficulty in walking, not elsewhere classified: Secondary | ICD-10-CM

## 2018-01-25 DIAGNOSIS — M25661 Stiffness of right knee, not elsewhere classified: Secondary | ICD-10-CM

## 2018-01-25 NOTE — Therapy (Signed)
[facility] [address] [place], [place], [address] Phone: [phone]   Fax:  [phone]  Physical Therapy Treatment  # OF FEET WALKED: unlimited indoors but with antalgia AROM:  Flexion:  90          Extension:  10 from neutral  Patient Details  Name: Jordan Roth MRN: [id number] Date of Birth: 28-Apr-1955 Referring Provider (PT): Theresa Duty, PA-C (Surgeon: Gaynelle Arabian, MD)   Encounter Date: 01/25/2018  PT End of Session - 01/25/18 1323    Visit Number  5    Number of Visits  19    Date for PT Re-Evaluation  02/26/18   mini reassess on 02/05/18   Authorization Type  [employer]    Authorization Time Period  01/15/18 to 02/26/18    Authorization - Visit Number  5    Authorization - Number of Visits  60   PT/OT/SLP combined   PT Start Time  2637    PT Stop Time  1345    PT Time Calculation (min)  42 min    Activity Tolerance  Patient tolerated treatment well    Behavior During Therapy  [facility] for tasks assessed/performed       Past Medical History:  Diagnosis Date  . Angina pectoris ([place]) 2017   ABNORMAL STRESS TEST CATH DONE AT [facility] RESULTS OK  . Cancer ([place]) 2000   Pre-esophogeal  . COPD (chronic obstructive pulmonary disease) ([place])   . Gout   . History of kidney stones   . Hyperlipidemia   . Muscle cramps at night   . Osteoarthrosis    OA  . Raynaud's disease    fingers  . Sleep apnea    NO CPAP USE LAST 6 MONTHS DUE TO IRRITATES NOSE    Past Surgical History:  Procedure Laterality Date  . CARDIAC CATHETERIZATION  2017 [facility]  . COLONOSCOPY    . surgery for barretts esophagus  2000  . surgery for kidney stone  2009 or 2010  . TOTAL KNEE ARTHROPLASTY Right 01/11/2018   Procedure: RIGHT TOTAL KNEE ARTHROPLASTY;  Surgeon: Gaynelle Arabian, MD;  Location: [facility];  Service: Orthopedics;  Laterality: Right;    There were no vitals filed for this visit.  Subjective Assessment - 01/25/18 1510    Subjective  PT states he only uses his RW outside.  States he's been keeping his incision covered.  Returns to MD tomorrow. Currently with stiffness, no real pain.    Currently in Pain?  No/denies         [facility] PT Assessment - 01/25/18 1339      Assessment   Medical Diagnosis  R TKA    Referring Provider (PT)  Theresa Duty, PA-C   Surgeon: Gaynelle Arabian, MD   Onset Date/Surgical Date  01/11/18    Next MD Visit  01/26/18    Prior Therapy  none      Prior Function   Level of Independence  Independent    Vocation  Retired    [employer]  still does Architect on [employer]    Leisure  play golf, basketball with grandson      Observation/Other Assessments   Focus on Therapeutic Outcomes (FOTO)   FS 52.58%      Observation/Other Assessments-Edema    Edema  Circumferential      Circumferential Edema   Circumferential - Right  40cm, joint line   was 40 cm   Circumferential - Left  33cm, jiont line      AROM   Right Knee Extension  10   was 15 degrees from neutral   Right Knee Flexion  90   was 75 degrees                  [facility] Adult PT Treatment/Exercise - 01/25/18 0001      Knee/Hip Exercises: Stretches   Passive Hamstring Stretch  Right;20 seconds;Limitations;3 reps    Passive Hamstring Stretch Limitations  using 12" step    Knee: Self-Stretch to increase Flexion  Right;10 seconds;Limitations    Knee: Self-Stretch Limitations  10 reps on 12" step    Gastroc Stretch  Right;3 reps;30 seconds    Gastroc Stretch Limitations  pushing heel down from step      Knee/Hip Exercises: Standing   Heel Raises  Both;Limitations;15 reps    Heel Raises Limitations  toeraises 15 reps    Knee Flexion  Right;15 reps      Knee/Hip Exercises: Supine   Quad Sets  Right;10 reps    Quad Sets Limitations  5" holds    Heel Slides  Right;10 reps    Heel Slides Limitations  3-5" holds at end range    Knee Extension  AROM    Knee Extension Limitations  10     Knee Flexion  AROM    Knee Flexion Limitations  90      Manual Therapy   Manual Therapy  Myofascial release    Myofascial Release  to decrease adhesions perimeter of knee               PT Short Term Goals - 01/25/18 1346      PT SHORT TERM GOAL #1   Title  Pt will have reduced R knee joint line edema by 3cm or > in order to improved ROM and reduce pain.    Baseline  11/11:  40cm at joint line (was 40cm)    Time  3    Period  Weeks    Status  On-going      PT SHORT TERM GOAL #2   Title  Pt will have improved R knee AROM from 8-100deg in order to maximize gait.     Time  3    Period  Weeks    Status  New      PT SHORT TERM GOAL #3   Title  Pt will be able to perform R SLS for 10 sec without UE support to maximize gait and demo improved functional strength.    Time  3    Period  Weeks    Status  On-going      PT SHORT TERM GOAL #4   Title  Pt will be able to perform 5xSTS in 12sec or < without UE and with proper form to demo improved balance and functional strength.     Time  3    Period  Weeks    Status  On-going        PT Long Term Goals - 01/25/18 1347      PT LONG TERM GOAL #1   Title  Pt will have improved R knee AROM from 0-120deg in order to further maximize gait and improve stair ambulation.    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    Time  6    Period  Weeks    Status  On-going      PT LONG TERM GOAL #3   Title  Pt will ambulate at least 691ft without AD and gait WNL in order to demo improved endurance and strength to allow him to maximize his community access and return to PLOF.     Time  6    Period  Weeks    Status  On-going      PT LONG TERM GOAL #4   Title  Pt will be able to perform bil SLS for 30sec or > without UE support in order to maximize  gait on uneven ground and allow him to return to his construction duties with greater ease.     Time  6    Period  Weeks    Status  On-going            Plan - 01/25/18 1512    Clinical Impression Statement  Pt with continued edema around knee and general stiffness.  AROM today 10-90. Pt encouraged to focus on ROM more at this point, completing throughout the day rather than 2X. Pt verbalized understanding.  continues to need considerable cues for therex as unable to recall technique or hold times.   FOTO completed this session at [facility] 52.58%.  Myofascial perimeter of knee reveals general tightness/adhesions especially medially.      Rehab Potential  Good    PT Frequency  3x / week    PT Duration  6 weeks    PT Treatment/Interventions  ADLs/Self Care Home Management;Aquatic Therapy;Cryotherapy;Electrical Stimulation;Moist Heat;Ultrasound;DME Instruction;Gait training;Stair training;Functional mobility training;Therapeutic activities;Therapeutic exercise;Balance training;Neuromuscular re-education;Patient/family education;Manual techniques;Orthotic Fit/Training;Scar mobilization;Passive range of motion;Dry needling;Energy conservation;Taping    PT Next Visit Plan  continue focus on ROM and edema prior to strengthening until AROM normalized. Begin bike rocking without resistance and biodex PROM to improve ROM. Continue with manual techniques to reduce scar tissue and edema.     PT Home Exercise Plan  eval: quad sets, heel slides, seated HS stretch    Consulted and Agree with Plan of Care  Patient       Patient will benefit from skilled therapeutic intervention in order to improve the following deficits and impairments:  Abnormal gait, Decreased activity tolerance, Decreased balance, Decreased endurance, Decreased mobility, Decreased range of motion, Decreased scar mobility, Decreased strength, Difficulty walking, Hypomobility, Increased edema, Increased fascial restricitons, Increased muscle  spasms, Impaired flexibility, Pain  Visit Diagnosis: Stiffness of right knee, not elsewhere classified  Localized edema  Difficulty in walking, not elsewhere classified     Problem List Patient Active Problem List   Diagnosis Date Noted  . OA (osteoarthritis) of knee 01/11/2018  . Gout 12/20/2015  . COPD (chronic obstructive pulmonary disease) ([place]) 12/20/2015  . Hyperlipidemia 12/20/2015  . Sleep apnea 12/20/2015  . Angina pectoris ([place]) 12/20/2015   Teena Irani, PTA/CLT [phone]  Teena Irani 01/25/2018, 3:17 PM  [facility] [address] [place], [place], [address] Phone: [phone]   Fax:  [phone]  Name: Jordan Roth MRN: [id number] Date of Birth: [birth year]

## 2018-01-26 DIAGNOSIS — Z96651 Presence of right artificial knee joint: Secondary | ICD-10-CM | POA: Insufficient documentation

## 2018-01-27 ENCOUNTER — Ambulatory Visit (HOSPITAL_COMMUNITY): Payer: 59

## 2018-01-27 ENCOUNTER — Other Ambulatory Visit: Payer: Self-pay

## 2018-01-27 ENCOUNTER — Encounter (HOSPITAL_COMMUNITY): Payer: Self-pay

## 2018-01-27 DIAGNOSIS — M25661 Stiffness of right knee, not elsewhere classified: Secondary | ICD-10-CM

## 2018-01-27 DIAGNOSIS — R262 Difficulty in walking, not elsewhere classified: Secondary | ICD-10-CM

## 2018-01-27 DIAGNOSIS — R600 Localized edema: Secondary | ICD-10-CM

## 2018-01-27 NOTE — Therapy (Signed)
Jordan Roth, [place], [address] Phone: [phone]   Fax:  [phone]  Physical Therapy Treatment  Patient Details  Name: Jordan Roth MRN: [id number] Date of Birth: 04-30-1955 Referring Provider (PT): Theresa Duty, PA-C (Surgeon: Gaynelle Arabian, MD)   Encounter Date: 01/27/2018  PT End of Session - 01/27/18 1326    Visit Number  6    Number of Visits  19    Date for PT Re-Evaluation  02/26/18   mini reassess on 02/05/18   Authorization Type  [employer]    Authorization Time Period  01/15/18 to 02/26/18    Authorization - Visit Number  6    Authorization - Number of Visits  60   PT/OT/SLP combined   PT Start Time  1301    PT Stop Time  9381    PT Time Calculation (min)  46 min    Activity Tolerance  Patient tolerated treatment well    Behavior During Therapy  [facility] for tasks assessed/performed       Past Medical History:  Diagnosis Date  . Angina pectoris ([place]) 2017   ABNORMAL STRESS TEST CATH DONE AT [facility] RESULTS OK  . Cancer ([place]) 2000   Pre-esophogeal  . COPD (chronic obstructive pulmonary disease) ([place])   . Gout   . History of kidney stones   . Hyperlipidemia   . Muscle cramps at night   . Osteoarthrosis    OA  . Raynaud's disease    fingers  . Sleep apnea    NO CPAP USE LAST 6 MONTHS DUE TO IRRITATES NOSE    Past Surgical History:  Procedure Laterality Date  . CARDIAC CATHETERIZATION  2017 [facility]  . COLONOSCOPY    . surgery for barretts esophagus  2000  . surgery for kidney stone  2009 or 2010  . TOTAL KNEE ARTHROPLASTY Right 01/11/2018   Procedure: RIGHT TOTAL KNEE ARTHROPLASTY;  Surgeon: Gaynelle Arabian, MD;  Location: [facility];  Service: Orthopedics;  Laterality: Right;    There were no vitals filed for this visit.  Subjective Assessment - 01/27/18 1328    Subjective  Patient reports he is doing well and he does not have pain today at arrival. He reports his MD visit went  good yesterday, he has returned to driving and his orthopedic surgeon was happy with his current ROM.    Limitations  House hold activities;Walking;Standing;Sitting    Patient Stated Goals  get back to like I was    Currently in Pain?  No/denies       [facility] Adult PT Treatment/Exercise - 01/27/18 0001      Exercises   Exercises  Knee/Hip      Knee/Hip Exercises: Stretches   Passive Hamstring Stretch  Right;Limitations;3 reps;30 seconds    Passive Hamstring Stretch Limitations  using 12" step    Knee: Self-Stretch to increase Flexion  Right;Limitations;3 reps;30 seconds    Knee: Self-Stretch Limitations  on 12" step      Knee/Hip Exercises: Aerobic   Stationary Bike  4 minutes for AROM half revolutions with Rt LE on seat 12      Knee/Hip Exercises: Machines for Strengthening   Other Machine  Biodex: PROM for Rt knee, 2x 10 reps to pt reported stretch for flex/ext.       Knee/Hip Exercises: Standing   Heel Raises  Both;Limitations;20 reps    Heel Raises Limitations  toeraises 20 reps    Knee Flexion  Right;15 reps  Knee/Hip Exercises: Supine   Quad Sets  Right;10 reps    Short Arc Quad Sets  --    Knee Extension  AROM    Knee Extension Limitations  7    Knee Flexion  AROM    Knee Flexion Limitations  90      Manual Therapy   Manual Therapy  Edema management    Manual therapy comments  completed separate rest of treatment    Edema Management  retro massage with BLE elevated to reduce edema        PT Education - 01/27/18 1326    Education Details  Edcuated on massage distal to proximal for Rt knee swelling. Also educated on usign water bottles in cryocuff machine to avoid having to buy ice as frequently.     Person(s) Educated  Patient    Methods  Explanation    Comprehension  Verbalized understanding       PT Short Term Goals - 01/25/18 1346      PT SHORT TERM GOAL #1   Title  Pt will have reduced R knee joint line edema by 3cm or > in order to improved ROM and  reduce pain.    Baseline  11/11:  40cm at joint line (was 40cm)    Time  3    Period  Weeks    Status  On-going      PT SHORT TERM GOAL #2   Title  Pt will have improved R knee AROM from 8-100deg in order to maximize gait.     Time  3    Period  Weeks    Status  New      PT SHORT TERM GOAL #3   Title  Pt will be able to perform R SLS for 10 sec without UE support to maximize gait and demo improved functional strength.    Time  3    Period  Weeks    Status  On-going      PT SHORT TERM GOAL #4   Title  Pt will be able to perform 5xSTS in 12sec or < without UE and with proper form to demo improved balance and functional strength.     Time  3    Period  Weeks    Status  On-going        PT Long Term Goals - 01/25/18 1347      PT LONG TERM GOAL #1   Title  Pt will have improved R knee AROM from 0-120deg in order to further maximize gait and improve stair ambulation.    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    Time  6    Period  Weeks    Status  On-going      PT LONG TERM GOAL #3   Title  Pt will ambulate at least 664ft without AD and gait WNL in order to demo improved endurance and strength to allow him to maximize his community access and return to PLOF.     Time  6    Period  Weeks    Status  On-going      PT LONG TERM GOAL #4   Title  Pt will be able to perform  bil SLS for 30sec or > without UE support in order to maximize gait on uneven ground and allow him to return to his construction duties with greater ease.     Time  6    Period  Weeks    Status  On-going        Plan - 01/27/18 1327    Clinical Impression Statement  Continued with plan to focus on ROM exercises and edema management. Initiated AROM on bike and PROM in biodex for Rt knee, patient reported decreased  stiffness after biodex this session. He continued with heel raises and was educated on benefit from edema management through muscle pump activation and stretches for knee extension/flexion were performed in standing. Retrograde massage continued as patient has ongoing edema and he was educated on benefits of elevation, ice, and massage at home to reduce edema. He will continue to benefit from skilled PT interventions to address impairments and progress ROM to improve functional mobility.    Rehab Potential  Good    PT Frequency  3x / week    PT Duration  6 weeks    PT Treatment/Interventions  ADLs/Self Care Home Management;Aquatic Therapy;Cryotherapy;Electrical Stimulation;Moist Heat;Ultrasound;DME Instruction;Gait training;Stair training;Functional mobility training;Therapeutic activities;Therapeutic exercise;Balance training;Neuromuscular re-education;Patient/family education;Manual techniques;Orthotic Fit/Training;Scar mobilization;Passive range of motion;Dry needling;Energy conservation;Taping    PT Next Visit Plan  Continue focus on ROM and edema prior to strengthening until AROM normalized. Continue with half revolutions on bike and biodex for PROM. Continue with manual techniques to reduce scar tissue and edema.      PT Home Exercise Plan  eval: quad sets, heel slides, seated HS stretch    Consulted and Agree with Plan of Care  Patient       Patient will benefit from skilled therapeutic intervention in order to improve the following deficits and impairments:  Abnormal gait, Decreased activity tolerance, Decreased balance, Decreased endurance, Decreased mobility, Decreased range of motion, Decreased scar mobility, Decreased strength, Difficulty walking, Hypomobility, Increased edema, Increased fascial restricitons, Increased muscle spasms, Impaired flexibility, Pain  Visit Diagnosis: Stiffness of right knee, not elsewhere classified  Localized edema  Difficulty in walking, not elsewhere  classified     Problem List Patient Active Problem List   Diagnosis Date Noted  . OA (osteoarthritis) of knee 01/11/2018  . Gout 12/20/2015  . COPD (chronic obstructive pulmonary disease) ([place]) 12/20/2015  . Hyperlipidemia 12/20/2015  . Sleep apnea 12/20/2015  . Angina pectoris ([place]) 12/20/2015    Kipp Brood, PT, DPT Physical Therapist with [facility]  01/27/2018 3:05 PM    [place], [place], [address] Phone: [phone]   Fax:  [phone]  Name: MARLOS CARMEN MRN: [id number] Date of Birth: Feb 09, 1956

## 2018-01-29 ENCOUNTER — Encounter (HOSPITAL_COMMUNITY): Payer: Self-pay

## 2018-01-29 ENCOUNTER — Ambulatory Visit (HOSPITAL_COMMUNITY): Payer: 59

## 2018-01-29 DIAGNOSIS — M25661 Stiffness of right knee, not elsewhere classified: Secondary | ICD-10-CM

## 2018-01-29 DIAGNOSIS — R262 Difficulty in walking, not elsewhere classified: Secondary | ICD-10-CM | POA: Diagnosis not present

## 2018-01-29 DIAGNOSIS — R600 Localized edema: Secondary | ICD-10-CM | POA: Diagnosis not present

## 2018-01-29 NOTE — Therapy (Signed)
[place], [place], [address] Phone: [phone]   Fax:  [phone]  Physical Therapy Treatment  Patient Details  Name: Jordan Roth MRN: [id number] Date of Birth: 1956-03-04 Referring Provider (PT): Theresa Duty, PA-C (Surgeon: Gaynelle Arabian, MD)   Encounter Date: 01/29/2018  PT End of Session - 01/29/18 1306    Visit Number  7    Number of Visits  19    Date for PT Re-Evaluation  02/26/18    Authorization Type  [employer]    Authorization Time Period  01/15/18 to 02/26/18    Authorization - Visit Number  6    Authorization - Number of Visits  60   PT/OT/SLP combined   PT Start Time  1301   4' on bike, not included wiht charges   PT Stop Time  1345    PT Time Calculation (min)  44 min    Activity Tolerance  Patient tolerated treatment well    Behavior During Therapy  [facility] for tasks assessed/performed       Past Medical History:  Diagnosis Date  . Angina pectoris ([place]) 2017   ABNORMAL STRESS TEST CATH DONE AT [facility] RESULTS OK  . Cancer ([place]) 2000   Pre-esophogeal  . COPD (chronic obstructive pulmonary disease) ([place])   . Gout   . History of kidney stones   . Hyperlipidemia   . Muscle cramps at night   . Osteoarthrosis    OA  . Raynaud's disease    fingers  . Sleep apnea    NO CPAP USE LAST 6 MONTHS DUE TO IRRITATES NOSE    Past Surgical History:  Procedure Laterality Date  . CARDIAC CATHETERIZATION  2017 [facility]  . COLONOSCOPY    . surgery for barretts esophagus  2000  . surgery for kidney stone  2009 or 2010  . TOTAL KNEE ARTHROPLASTY Right 01/11/2018   Procedure: RIGHT TOTAL KNEE ARTHROPLASTY;  Surgeon: Gaynelle Arabian, MD;  Location: [facility];  Service: Orthopedics;  Laterality: Right;    There were no vitals filed for this visit.  Subjective Assessment - 01/29/18 1304    Subjective  Pt stated he is tired today, has been on his feet alot today.  Current pain scale 2-3/10     Patient Stated Goals  get back to like I was    Currently in Pain?  Yes    Pain Score  3     Pain Location  Knee    Pain Orientation  Right    Pain Descriptors / Indicators  Aching    Pain Type  Surgical pain    Pain Onset  In the past 7 days    Pain Frequency  Intermittent    Aggravating Factors   nothing really    Pain Relieving Factors  nothing really    Effect of Pain on Daily Activities  increases                       [facility] Adult PT Treatment/Exercise - 01/29/18 0001      Exercises   Exercises  Knee/Hip      Knee/Hip Exercises: Stretches   Passive Hamstring Stretch  Right;Limitations;3 reps;30 seconds    Passive Hamstring Stretch Limitations  supine    Quad Stretch  Right;3 reps;30 seconds    Quad Stretch Limitations  prone wiht rope    Knee: Self-Stretch to increase Flexion  Right;10 seconds    Knee: Self-Stretch  Limitations  knee drive [address] 10" on 12" step    Gastroc Stretch  Right;3 reps;30 seconds      Knee/Hip Exercises: Aerobic   Stationary Bike  4 minutes for AROM half revolutions with Rt LE on seat 12      Knee/Hip Exercises: Machines for Strengthening   Other Machine  Biodex: PROM for Rt knee, 2x 10 reps to pt reported stretch for flex/ext.       Knee/Hip Exercises: Supine   Quad Sets  Right;10 reps    [employer] Limitations  5" holds    Short Arc [employer]  15 reps    Short Arc Quad Sets Limitations  3-5" holds    Heel Slides  Right;10 reps    Heel Slides Limitations  3-5" holds at end range    Knee Extension  AROM    Knee Extension Limitations  6    Knee Flexion  AROM    Knee Flexion Limitations  102   was 90     Knee/Hip Exercises: Prone   Other Prone Exercises  TKE 10x 3"      Manual Therapy   Manual Therapy  Edema management;Myofascial release    Manual therapy comments  completed separate rest of treatment    Edema Management  retro massage with BLE elevated to reduce edema    Myofascial Release  to decrease adhesions perimeter  of knee               PT Short Term Goals - 01/25/18 1346      PT SHORT TERM GOAL #1   Title  Pt will have reduced R knee joint line edema by 3cm or > in order to improved ROM and reduce pain.    Baseline  11/11:  40cm at joint line (was 40cm)    Time  3    Period  Weeks    Status  On-going      PT SHORT TERM GOAL #2   Title  Pt will have improved R knee AROM from 8-100deg in order to maximize gait.     Time  3    Period  Weeks    Status  New      PT SHORT TERM GOAL #3   Title  Pt will be able to perform R SLS for 10 sec without UE support to maximize gait and demo improved functional strength.    Time  3    Period  Weeks    Status  On-going      PT SHORT TERM GOAL #4   Title  Pt will be able to perform 5xSTS in 12sec or < without UE and with proper form to demo improved balance and functional strength.     Time  3    Period  Weeks    Status  On-going        PT Long Term Goals - 01/25/18 1347      PT LONG TERM GOAL #1   Title  Pt will have improved R knee AROM from 0-120deg in order to further maximize gait and improve stair ambulation.    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    Time  6  Period  Weeks    Status  On-going      PT LONG TERM GOAL #3   Title  Pt will ambulate at least 685ft without AD and gait WNL in order to demo improved endurance and strength to allow him to maximize his community access and return to PLOF.     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     Time  6    Period  Weeks    Status  On-going            Plan -  01/29/18 1349    Clinical Impression Statement  Continued session focus with knee mobility.  Continued with ROM exercises and manual techniuqes to address edema proximal knee.  Added quad stretches to improve flexion as well as TKE for quad strengthening to improve extension.  Pt tolerated well to PROM in biodex and no reports of increased pain with all activiiees.  EOS with manual MFR to address adhesions on incision and retromassage for edema control.  Pt making good gains with ROM, improved to 6-102 degrees at EOS (was 7-90 degrees last session).    Rehab Potential  Good    PT Frequency  3x / week    PT Treatment/Interventions  ADLs/Self Care Home Management;Aquatic Therapy;Cryotherapy;Electrical Stimulation;Moist Heat;Ultrasound;DME Instruction;Gait training;Stair training;Functional mobility training;Therapeutic activities;Therapeutic exercise;Balance training;Neuromuscular re-education;Patient/family education;Manual techniques;Orthotic Fit/Training;Scar mobilization;Passive range of motion;Dry needling;Energy conservation;Taping    PT Next Visit Plan  Continue focus on ROM and edema prior to strengthening until AROM normalized. Continue with half revolutions on bike and biodex for PROM. Continue with manual techniques to reduce scar tissue and edema.      PT Home Exercise Plan  eval: quad sets, heel slides, seated HS stretch       Patient will benefit from skilled therapeutic intervention in order to improve the following deficits and impairments:  Abnormal gait, Decreased activity tolerance, Decreased balance, Decreased endurance, Decreased mobility, Decreased range of motion, Decreased scar mobility, Decreased strength, Difficulty walking, Hypomobility, Increased edema, Increased fascial restricitons, Increased muscle spasms, Impaired flexibility, Pain  Visit Diagnosis: Stiffness of right knee, not elsewhere classified  Localized edema  Difficulty in walking, not elsewhere  classified     Problem List Patient Active Problem List   Diagnosis Date Noted  . OA (osteoarthritis) of knee 01/11/2018  . Gout 12/20/2015  . COPD (chronic obstructive pulmonary disease) ([place]) 12/20/2015  . Hyperlipidemia 12/20/2015  . Sleep apnea 12/20/2015  . Angina pectoris [facility]) 12/20/2015   Jordan Roth, [place]; [place]  Aldona Lento 01/29/2018, 2:01 PM  [place] [address] [place], [place], [address] Phone: [phone]   Fax:  [phone]  Name: Jordan Roth MRN: [id number] Date of Birth: [birth year]

## 2018-02-01 ENCOUNTER — Ambulatory Visit (HOSPITAL_COMMUNITY): Payer: 59 | Admitting: Physical Therapy

## 2018-02-01 DIAGNOSIS — M25661 Stiffness of right knee, not elsewhere classified: Secondary | ICD-10-CM | POA: Diagnosis not present

## 2018-02-01 DIAGNOSIS — R262 Difficulty in walking, not elsewhere classified: Secondary | ICD-10-CM

## 2018-02-01 DIAGNOSIS — R600 Localized edema: Secondary | ICD-10-CM

## 2018-02-01 NOTE — Therapy (Signed)
[place], [place], [address] Phone: [phone]   Fax:  [phone]  Physical Therapy Treatment  Patient Details  Name: Jordan Roth MRN: [id number] Date of Birth: [birth year] Referring Provider (PT): Theresa Duty, PA-C (Surgeon: Gaynelle Arabian, MD)   Encounter Date: 02/01/2018  PT End of Session - 02/01/18 1406    Visit Number  8    Number of Visits  19    Date for PT Re-Evaluation  02/26/18    Authorization Type  [employer]    Authorization Time Period  01/15/18 to 02/26/18    Authorization - Visit Number  8    Authorization - Number of Visits  60   PT/OT/SLP combined   PT Start Time  7341    PT Stop Time  1355    PT Time Calculation (min)  52 min    Activity Tolerance  Patient tolerated treatment well    Behavior During Therapy  [facility] for tasks assessed/performed       Past Medical History:  Diagnosis Date  . Angina pectoris ([place]) 2017   ABNORMAL STRESS TEST CATH DONE AT [facility] RESULTS OK  . Cancer ([place]) 2000   Pre-esophogeal  . COPD (chronic obstructive pulmonary disease) ([place])   . Gout   . History of kidney stones   . Hyperlipidemia   . Muscle cramps at night   . Osteoarthrosis    OA  . Raynaud's disease    fingers  . Sleep apnea    NO CPAP USE LAST 6 MONTHS DUE TO IRRITATES NOSE    Past Surgical History:  Procedure Laterality Date  . CARDIAC CATHETERIZATION  2017 [facility]  . COLONOSCOPY    . surgery for barretts esophagus  2000  . surgery for kidney stone  2009 or 2010  . TOTAL KNEE ARTHROPLASTY Right 01/11/2018   Procedure: RIGHT TOTAL KNEE ARTHROPLASTY;  Surgeon: Gaynelle Arabian, MD;  Location: [facility];  Service: Orthopedics;  Laterality: Right;    There were no vitals filed for this visit.  Subjective Assessment - 02/01/18 1317    Subjective  Pt states no pain, just pulling and tightness.  States his knee bruised up after last session (over patella) unsure why.  Did add new  prone lying exercise last session.    Currently in Pain?  No/denies                       [facility] Adult PT Treatment/Exercise - 02/01/18 0001      Exercises   Exercises  Knee/Hip      Knee/Hip Exercises: Stretches   Passive Hamstring Stretch  Right;Limitations;3 reps;30 seconds    Passive Hamstring Stretch Limitations  supine    Knee: Self-Stretch to increase Flexion  Right;10 seconds    Knee: Self-Stretch Limitations  knee drive 93X 10" on 12" step    Gastroc Stretch  Right;3 reps;30 seconds      Knee/Hip Exercises: Aerobic   Stationary Bike  4 minutes for AROM half revolutions with Rt LE on seat 12      Knee/Hip Exercises: Machines for Strengthening   Other Machine  Biodex: PROM for Rt knee 5 minutes 85% extension (due to locking out) and 95% flexion set at full limits      Knee/Hip Exercises: Standing   Heel Raises  Both;Limitations;20 reps    Heel Raises Limitations  toeraises 20 reps    Knee Flexion  Right;15 reps  Gait Training  heel to toe and bending knee, no AD      Knee/Hip Exercises: Seated   Ball Squeeze  10X with towel, hip add iso      Knee/Hip Exercises: Supine   Quad Sets  Right;10 reps    Quad Sets Limitations  5" holds    Short Arc [employer]  15 reps    Short Arc Quad Sets Limitations  5" holds    Heel Slides  Right;10 reps    Heel Slides Limitations  3-5" holds at end range    Knee Extension  AROM    Knee Extension Limitations  6    Knee Flexion  AROM    Knee Flexion Limitations  102      Knee/Hip Exercises: Prone   Prone Knee Hang  2 minutes    Prone Knee Hang Weights (lbs)  with manual       Manual Therapy   Manual Therapy  Edema management;Myofascial release    Manual therapy comments  completed separate rest of treatment    Edema Management  retro massage with BLE elevated to reduce edema    Myofascial Release  prone and supine to lateral knee to decrease adhesions perimeter of knee               PT Short Term Goals  - 01/25/18 1346      PT SHORT TERM GOAL #1   Title  Pt will have reduced R knee joint line edema by 3cm or > in order to improved ROM and reduce pain.    Baseline  11/11:  40cm at joint line (was 40cm)    Time  3    Period  Weeks    Status  On-going      PT SHORT TERM GOAL #2   Title  Pt will have improved R knee AROM from 8-100deg in order to maximize gait.     Time  3    Period  Weeks    Status  New      PT SHORT TERM GOAL #3   Title  Pt will be able to perform R SLS for 10 sec without UE support to maximize gait and demo improved functional strength.    Time  3    Period  Weeks    Status  On-going      PT SHORT TERM GOAL #4   Title  Pt will be able to perform 5xSTS in 12sec or < without UE and with proper form to demo improved balance and functional strength.     Time  3    Period  Weeks    Status  On-going        PT Long Term Goals - 01/25/18 1347      PT LONG TERM GOAL #1   Title  Pt will have improved R knee AROM from 0-120deg in order to further maximize gait and improve stair ambulation.    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    Time  6    Period  Weeks    Status  On-going      PT LONG TERM GOAL #3   Title  Pt will ambulate at least  636ft without AD and gait WNL in order to demo improved endurance and strength to allow him to maximize his community access and return to PLOF.     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     Time  6    Period  Weeks    Status  On-going            Plan - 02/01/18 1406    Clinical Impression Statement  Pt  with continued tightness, ambulation with "stiff" Rt LE and over emphasis on heel-toe gait.  pt without any noted bruising to Rt knee, however did have some swelling, mild heat but no indications of infection.  continued with biodex to help increase ROM holding at 6-102. Added prone knee hang with manual to address tightness in posteriolateral LE.  Retro completed in supine to further reduce fluid.  Instructed to complete hip add to help improve strength and alignment of Rt LE.     Rehab Potential  Good    PT Frequency  3x / week    PT Treatment/Interventions  ADLs/Self Care Home Management;Aquatic Therapy;Cryotherapy;Electrical Stimulation;Moist Heat;Ultrasound;DME Instruction;Gait training;Stair training;Functional mobility training;Therapeutic activities;Therapeutic exercise;Balance training;Neuromuscular re-education;Patient/family education;Manual techniques;Orthotic Fit/Training;Scar mobilization;Passive range of motion;Dry needling;Energy conservation;Taping    PT Next Visit Plan  Continue focus on ROM and edema prior to strengthening until AROM normalized. Continue with half revolutions on bike and biodex for PROM. Continue with manual techniques to reduce scar tissue and edema.      PT Home Exercise Plan  eval: quad sets, heel slides, seated HS stretch       Patient will benefit from skilled therapeutic intervention in order to improve the following deficits and impairments:  Abnormal gait, Decreased activity tolerance, Decreased balance, Decreased endurance, Decreased mobility, Decreased range of motion, Decreased scar mobility, Decreased strength, Difficulty walking, Hypomobility, Increased edema, Increased fascial restricitons, Increased muscle spasms, Impaired flexibility, Pain  Visit Diagnosis: Stiffness of right knee, not elsewhere classified  Localized edema  Difficulty in walking, not elsewhere classified     Problem List Patient Active Problem List   Diagnosis Date Noted  .  OA (osteoarthritis) of knee 01/11/2018  . Gout 12/20/2015  . COPD (chronic obstructive pulmonary disease) ([place]) 12/20/2015  . Hyperlipidemia 12/20/2015  . Sleep apnea 12/20/2015  . Angina pectoris ([place]) 12/20/2015   Teena Irani, PTA/CLT [phone]   Teena Irani 02/01/2018, 2:12 PM  [place] [address] [place], [place], [address] Phone: [phone]   Fax:  [phone]  Name: Jordan Roth MRN: [id number] Date of Birth: Aug 01, 1955

## 2018-02-03 ENCOUNTER — Ambulatory Visit (HOSPITAL_COMMUNITY): Payer: 59

## 2018-02-03 ENCOUNTER — Encounter (HOSPITAL_COMMUNITY): Payer: Self-pay

## 2018-02-03 DIAGNOSIS — R262 Difficulty in walking, not elsewhere classified: Secondary | ICD-10-CM

## 2018-02-03 DIAGNOSIS — R600 Localized edema: Secondary | ICD-10-CM

## 2018-02-03 DIAGNOSIS — M25661 Stiffness of right knee, not elsewhere classified: Secondary | ICD-10-CM | POA: Diagnosis not present

## 2018-02-03 NOTE — Therapy (Signed)
[place], [place], [address] Phone: [phone]   Fax:  [phone]  Physical Therapy Treatment  Patient Details  Name: Jordan Roth MRN: [id number] Date of Birth: 1955/04/28 Referring Provider (PT): Theresa Duty, PA-C (Surgeon: Gaynelle Arabian, MD)   Encounter Date: 02/03/2018  PT End of Session - 02/03/18 1256    Visit Number  9    Number of Visits  19    Date for PT Re-Evaluation  02/26/18   mini reassess on 02/05/18   Authorization Type  [employer]    Authorization Time Period  01/15/18 to 02/26/18    Authorization - Visit Number  9    Authorization - Number of Visits  60   PT/OT/SLP combined   PT Start Time  4259    PT Stop Time  1340    PT Time Calculation (min)  42 min    Activity Tolerance  Patient tolerated treatment well    Behavior During Therapy  [facility] for tasks assessed/performed       Past Medical History:  Diagnosis Date  . Angina pectoris ([place]) 2017   ABNORMAL STRESS TEST CATH DONE AT [facility] RESULTS OK  . Cancer ([place]) 2000   Pre-esophogeal  . COPD (chronic obstructive pulmonary disease) ([place])   . Gout   . History of kidney stones   . Hyperlipidemia   . Muscle cramps at night   . Osteoarthrosis    OA  . Raynaud's disease    fingers  . Sleep apnea    NO CPAP USE LAST 6 MONTHS DUE TO IRRITATES NOSE    Past Surgical History:  Procedure Laterality Date  . CARDIAC CATHETERIZATION  2017 [facility]  . COLONOSCOPY    . surgery for barretts esophagus  2000  . surgery for kidney stone  2009 or 2010  . TOTAL KNEE ARTHROPLASTY Right 01/11/2018   Procedure: RIGHT TOTAL KNEE ARTHROPLASTY;  Surgeon: Gaynelle Arabian, MD;  Location: [facility];  Service: Orthopedics;  Laterality: Right;    There were no vitals filed for this visit.  Subjective Assessment - 02/03/18 1300    Subjective  Pt states that he has really been working on the bending and he wants to make sure he didn't mes sup his  scar.     Currently in Pain?  No/denies             [facility] Adult PT Treatment/Exercise - 02/03/18 0001      Exercises   Exercises  Knee/Hip      Knee/Hip Exercises: Stretches   Passive Hamstring Stretch  Right;3 reps;30 seconds    Passive Hamstring Stretch Limitations  standing, 12" step, +overpressure     Knee: Self-Stretch to increase Flexion  Right;10 seconds    Knee: Self-Stretch Limitations  knee drive 56L 10" on 12" step    Gastroc Stretch  Right;3 reps;30 seconds    Gastroc Stretch Limitations  slant board      Knee/Hip Exercises: Aerobic   Stationary Bike  x77mins, 1/2 and retro revolutions, for mobility      Knee/Hip Exercises: Standing   Terminal Knee Extension  Right;10 reps    Theraband Level (Terminal Knee Extension)  Level 2 (Red)    Terminal Knee Extension Limitations  10" holds    Rocker Board  2 minutes    Rocker Board Limitations  R/L      Knee/Hip Exercises: Supine   Quad Sets  Right;10 reps    [employer]  Limitations  5" holds    Short Arc [employer]  15 reps    Short Arc Quad Sets Limitations  5" holds    Straight Leg Raises  Right;15 reps    Straight Leg Raises Limitations  quad set prior    Knee Extension Limitations  7    Knee Flexion Limitations  102    Other Supine Knee/Hip Exercises  supine heel prop 3# x46mins for extension      Manual Therapy   Manual Therapy  Edema management;Joint mobilization;Myofascial release    Manual therapy comments  completed separate rest of treatment    Edema Management  retro massage with BLE elevated to reduce edema    Joint Mobilization  patellar mobs all directions for mobility    Myofascial Release  proximal scar, VLO, VMO, and HS to reduce restrictions and improve ROM             PT Education - 02/03/18 1257    Education Details  updated HEP, will reassess next visit    Person(s) Educated  Patient    Methods  Explanation;Handout;Demonstration    Comprehension  Verbalized understanding;Returned  demonstration       PT Short Term Goals - 01/25/18 1346      PT SHORT TERM GOAL #1   Title  Pt will have reduced R knee joint line edema by 3cm or > in order to improved ROM and reduce pain.    Baseline  11/11:  40cm at joint line (was 40cm)    Time  3    Period  Weeks    Status  On-going      PT SHORT TERM GOAL #2   Title  Pt will have improved R knee AROM from 8-100deg in order to maximize gait.     Time  3    Period  Weeks    Status  New      PT SHORT TERM GOAL #3   Title  Pt will be able to perform R SLS for 10 sec without UE support to maximize gait and demo improved functional strength.    Time  3    Period  Weeks    Status  On-going      PT SHORT TERM GOAL #4   Title  Pt will be able to perform 5xSTS in 12sec or < without UE and with proper form to demo improved balance and functional strength.     Time  3    Period  Weeks    Status  On-going        PT Long Term Goals - 01/25/18 1347      PT LONG TERM GOAL #1   Title  Pt will have improved R knee AROM from 0-120deg in order to further maximize gait and improve stair ambulation.    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    Time  6    Period  Weeks    Status  On-going      PT LONG TERM GOAL #3   Title  Pt will ambulate at least 630ft without AD and gait WNL in order to demo improved endurance and strength to allow him to maximize his community access  and return to PLOF.     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     Time  6    Period  Weeks    Status  On-going            Plan - 02/03/18  1342    Clinical Impression Statement  Pt presents to therapy reporting that he has really been working on his bending and he said the bottom of his scar looks different than the top of it; PT assessed and it does look to have a very superficial separation compared to the more proximal scar, but it is not red or show signs of infection but educated pt to keep a close eye on this and he verbalized understanding. Today's session focused on improving ROM and reducing edema. Added standing TKE and resumed rockerboard. Also added supine heel prop for improved extension ROM and educated pt to perform at home. Ended with manual for edema, soft tissue restrictions and joint mobility. AROM 7 to 102deg this date. Pt due for reassessment next visit.     Rehab Potential  Good    PT Frequency  3x / week    PT Treatment/Interventions  ADLs/Self Care Home Management;Aquatic Therapy;Cryotherapy;Electrical Stimulation;Moist Heat;Ultrasound;DME Instruction;Gait training;Stair training;Functional mobility training;Therapeutic activities;Therapeutic exercise;Balance training;Neuromuscular re-education;Patient/family education;Manual techniques;Orthotic Fit/Training;Scar mobilization;Passive range of motion;Dry needling;Energy conservation;Taping    PT Next Visit Plan  reassessment; consider k-tape or compression garment for edema; Continue focus on ROM and edema prior to strengthening until AROM normalized. Continue with half revolutions on bike and biodex for PROM. Continue with manual techniques to reduce scar tissue and edema.      PT Home Exercise Plan  eval: quad sets, heel slides, seated HS stretch; 11/20: supine heel prop, SAQ, LAQ, SLR    Consulted and Agree with Plan of Care  Patient       Patient will benefit from skilled therapeutic intervention in order to improve the following deficits and impairments:  Abnormal gait, Decreased activity tolerance, Decreased balance, Decreased endurance, Decreased mobility,  Decreased range of motion, Decreased scar mobility, Decreased strength, Difficulty walking, Hypomobility, Increased edema, Increased fascial restricitons, Increased muscle spasms, Impaired flexibility, Pain  Visit Diagnosis: Stiffness of right knee, not elsewhere classified  Localized edema  Difficulty in walking, not elsewhere classified     Problem List Patient Active Problem List   Diagnosis Date Noted  . OA (osteoarthritis) of knee 01/11/2018  . Gout 12/20/2015  . COPD (chronic obstructive pulmonary disease) ([place]) 12/20/2015  . Hyperlipidemia 12/20/2015  . Sleep apnea 12/20/2015  . Angina pectoris ([place]) 12/20/2015       Geraldine Solar PT, DPT   [place] [address] [place], [place], [address] Phone: [phone]   Fax:  [phone]  Name: Jordan Roth MRN: [id number] Date of Birth: 15-Mar-1956

## 2018-02-05 ENCOUNTER — Encounter (HOSPITAL_COMMUNITY): Payer: Self-pay

## 2018-02-05 ENCOUNTER — Ambulatory Visit (HOSPITAL_COMMUNITY): Payer: 59

## 2018-02-05 DIAGNOSIS — M25661 Stiffness of right knee, not elsewhere classified: Secondary | ICD-10-CM | POA: Diagnosis not present

## 2018-02-05 DIAGNOSIS — R262 Difficulty in walking, not elsewhere classified: Secondary | ICD-10-CM

## 2018-02-05 DIAGNOSIS — R600 Localized edema: Secondary | ICD-10-CM

## 2018-02-05 NOTE — Therapy (Signed)
[place], [place], [address] Phone: [phone]   Fax:  [phone]   Progress Note Reporting Period 01/15/18 to 02/05/18  See note below for Objective Data and Assessment of Progress/Goals.    Physical Therapy Treatment  Patient Details  Name: Jordan Roth MRN: [id number] Date of Birth: 06/28/1955 Referring Provider (PT): Theresa Duty, PA-C (Surgeon: Gaynelle Arabian, MD)   Encounter Date: 02/05/2018  PT End of Session - 02/05/18 1257    Visit Number  10    Number of Visits  19    Date for PT Re-Evaluation  02/26/18   mini reassess completed on 02/05/18 (10th visit)   Authorization Type  [employer]    Authorization Time Period  01/15/18 to 02/26/18    Authorization - Visit Number  10    Authorization - Number of Visits  60   PT/OT/SLP combined   PT Start Time  0923    PT Stop Time  1344    PT Time Calculation (min)  45 min    Activity Tolerance  Patient tolerated treatment well    Behavior During Therapy  [facility] for tasks assessed/performed       Past Medical History:  Diagnosis Date  . Angina pectoris ([place]) 2017   ABNORMAL STRESS TEST CATH DONE AT [facility] RESULTS OK  . Cancer ([place]) 2000   Pre-esophogeal  . COPD (chronic obstructive pulmonary disease) ([place])   . Gout   . History of kidney stones   . Hyperlipidemia   . Muscle cramps at night   . Osteoarthrosis    OA  . Raynaud's disease    fingers  . Sleep apnea    NO CPAP USE LAST 6 MONTHS DUE TO IRRITATES NOSE    Past Surgical History:  Procedure Laterality Date  . CARDIAC CATHETERIZATION  2017 [facility]  . COLONOSCOPY    . surgery for barretts esophagus  2000  . surgery for kidney stone  2009 or 2010  . TOTAL KNEE ARTHROPLASTY Right 01/11/2018   Procedure: RIGHT TOTAL KNEE ARTHROPLASTY;  Surgeon: Gaynelle Arabian, MD;  Location: [facility];  Service: Orthopedics;  Laterality: Right;    There were no vitals filed for this  visit.  Subjective Assessment - 02/05/18 1300    Subjective  Pt reports that he is doing well today.     Currently in Pain?  No/denies         [facility] PT Assessment - 02/05/18 0001      Assessment   Medical Diagnosis  R TKA    Referring Provider (PT)  Theresa Duty, PA-C   Surgeon: Gaynelle Arabian, MD   Onset Date/Surgical Date  01/11/18    Next MD Visit  01/26/18    Prior Therapy  none      Observation/Other Assessments   Focus on Therapeutic Outcomes (FOTO)   --      Circumferential Edema   Circumferential - Right  37.75cm, joint line   was 40cm joint line     AROM   Right Knee Extension  10   was 15   Right Knee Flexion  102   was 75     Strength   Right Hip Flexion  5/5   was 4   Right Hip Extension  4+/5   was 4+   Right Hip ABduction  4+/5   was 4+   Left Hip Flexion  5/5   wa 4+   Left Hip Extension  4+/5  wa s4   Left Hip ABduction  4+/5   was 4+   Right Knee Flexion  4+/5   was 4+   Right Knee Extension  4+/5   was 4     Ambulation/Gait   Ambulation Distance (Feet)  718 Feet   3MWT; was 365f with RW   Assistive device  None    Gait Pattern  Step-through pattern;Decreased dorsiflexion - right;Decreased hip/knee flexion - right;Trendelenburg;Antalgic    Gait Comments  continues to lack heel to toe gait, increased knee flexion during heel strike and decreased knee flexion during swing thru      Balance   Balance Assessed  Yes      Static Standing Balance   Static Standing - Balance Support  No upper extremity supported    Static Standing Balance -  Activities   Single Leg Stance - Right Leg;Single Leg Stance - Left Leg    Static Standing - Comment/# of Minutes  R: 43sec L: 16sec   was 3sec R, 20sec L     Standardized Balance Assessment   Standardized Balance Assessment  Five Times Sit to Stand    Five times sit to stand comments   12.7 sec, no UE, RLE slightly extended out   was 23.4sec, BUE support with RLE extended          [facility] - 02/05/18 0001      Manual Therapy   Manual Therapy  Edema management;Joint mobilization;Taping    Manual therapy comments  completed separate rest of treatment    Edema Management  retro massage with BLE elevated to reduce edema    Joint Mobilization  patellar mobs all directions and Grade III AP/PA tibiofemoral joint mobs for knee flexion and extension ROM    Kinesiotex  Edema      Kinesiotix   Edema  x2 strips (1 on thigh, 1 on anterior lower leg) to reduce edema           PT Education - 02/05/18 1300    Education Details  reassessment findings, continue HEP    Person(s) Educated  Patient    Methods  Explanation;Demonstration    Comprehension  Verbalized understanding;Returned demonstration       PT Short Term Goals - 02/05/18 1501      PT SHORT TERM GOAL #1   Title  Pt will have reduced R knee joint line edema by 3cm or > in order to improved ROM and reduce pain.    Baseline  11/22: 37.75cm jiont line    Time  3    Period  Weeks    Status  On-going      PT SHORT TERM GOAL #2   Title  Pt will have improved R knee AROM from 8-100deg in order to maximize gait.     Baseline  11/22: 10-102deg    Time  3    Period  Weeks    Status  Partially Met      PT SHORT TERM GOAL #3   Title  Pt will be able to perform R SLS for 10 sec without UE support to maximize gait and demo improved functional strength.    Baseline  11/22: 43sec RLE    Time  3    Period  Weeks    Status  Achieved      PT SHORT TERM GOAL #4   Title  Pt will be able to perform 5xSTS in 12sec or < without UE and with  proper form to demo improved balance and functional strength.     Baseline  11/22: 12.7sec, RLE slightly extended, no UE    Time  3    Period  Weeks    Status  On-going        PT Long Term Goals - 02/05/18 1502      PT LONG TERM GOAL #1   Title  Pt will have improved R knee AROM from 0-120deg in order to further maximize gait and improve stair ambulation.     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    Baseline  11/22: see MMT    Time  6    Period  Weeks    Status  On-going      PT LONG TERM GOAL #3   Title  Pt will ambulate at least 628f without AD and gait WNL during 3MWT in order to demo improved endurance and strength to allow him to maximize his community access and return to PLOF.     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     Baseline  11/22: R: 43sec, L: 16sec    Time  6    Period  Weeks    Status  Partially Met            Plan - 02/05/18 1449    Clinical Impression Statement  PT reassessed pt's goals and outcome measures this date. Pt has made great progress towards goals as illustrated above. His AROM was 10-102deg by EOS and at his initial eval he was 15-75deg. His strength has progressed nicely as illustrated by his MMT, and his balance and tolerance to ambulation have significantly improved as he performed R SLS for 43 sec and ambulated 71863fo AD during 3MWT (compared to 308f47f eval with RW), though gait deviations continue. His main limitation is his swelling; though it has reduced by 2.25cm at joint line, he still has increased swelling at joint line when compared to his L knee. Overall, pt is making good progress, but he needs continued skilled PT intervention to further address edema, ROM, and overall functional strength and mobility. Ended today's session with manual joint mobs, edema control, and applied k-tape  to pt's RLE to promote reduced edema. Continue as planned, progressing as able.     Rehab Potential  Good    PT Frequency  3x / week    PT Treatment/Interventions  ADLs/Self Care Home Management;Aquatic Therapy;Cryotherapy;Electrical Stimulation;Moist Heat;Ultrasound;DME Instruction;Gait training;Stair training;Functional mobility training;Therapeutic activities;Therapeutic exercise;Balance training;Neuromuscular re-education;Patient/family education;Manual techniques;Orthotic Fit/Training;Scar mobilization;Passive range of motion;Dry needling;Energy conservation;Taping    PT Next Visit Plan  continue k-tape; potentially send off referral for compression garment for edema if k-tape not helping; Continue focus on ROM and edema prior to strengthening until AROM normalized.  Continue with half revolutions on bike and biodex for PROM. Continue with manual techniques to reduce scar tissue and edema.      PT Home Exercise Plan  eval: quad sets, heel slides, seated HS stretch; 11/20: supine heel prop, SAQ, LAQ, SLR    Consulted and Agree with Plan of Care  Patient       Patient will benefit from skilled therapeutic intervention in order to improve the following deficits and impairments:  Abnormal gait, Decreased activity tolerance, Decreased balance, Decreased endurance, Decreased mobility, Decreased range of motion, Decreased scar mobility, Decreased strength, Difficulty walking, Hypomobility, Increased edema, Increased fascial restricitons, Increased muscle spasms, Impaired flexibility, Pain  Visit Diagnosis: Stiffness of right knee, not elsewhere classified  Localized edema  Difficulty in walking, not elsewhere classified     Problem List Patient Active Problem List   Diagnosis Date Noted  . OA (osteoarthritis) of knee 01/11/2018  . Gout 12/20/2015  . COPD (chronic obstructive pulmonary disease) ([place]) 12/20/2015  . Hyperlipidemia 12/20/2015  . Sleep apnea 12/20/2015  . Angina pectoris ([place])  12/20/2015        Geraldine Solar PT, DPT  [place] [address] [place], [place], [address] Phone: [phone]   Fax:  [phone]  Name: Jordan Roth MRN: [id number] Date of Birth: 1955/10/30

## 2018-02-08 ENCOUNTER — Encounter (HOSPITAL_COMMUNITY): Payer: Self-pay

## 2018-02-08 ENCOUNTER — Ambulatory Visit (HOSPITAL_COMMUNITY): Payer: 59

## 2018-02-08 DIAGNOSIS — R262 Difficulty in walking, not elsewhere classified: Secondary | ICD-10-CM

## 2018-02-08 DIAGNOSIS — M25661 Stiffness of right knee, not elsewhere classified: Secondary | ICD-10-CM

## 2018-02-08 DIAGNOSIS — R600 Localized edema: Secondary | ICD-10-CM

## 2018-02-08 NOTE — Therapy (Signed)
[place], [place], [address] Phone: [phone]   Fax:  [phone]  Physical Therapy Treatment  Patient Details  Name: Jordan Roth MRN: [id number] Date of Birth: [birth year] Referring Provider (PT): Theresa Duty, PA-C (Surgeon: Gaynelle Arabian, MD)   Encounter Date: 02/08/2018  PT End of Session - 02/08/18 1301    Visit Number  11    Number of Visits  19    Date for PT Re-Evaluation  02/26/18   mini reassess completed on 02/05/18 (10th visit)   Authorization Type  [employer]    Authorization Time Period  01/15/18 to 02/26/18    Authorization - Visit Number  11    Authorization - Number of Visits  60   PT/OT/SLP combined   PT Start Time  7517    PT Stop Time  1345    PT Time Calculation (min)  47 min    Activity Tolerance  Patient tolerated treatment well    Behavior During Therapy  [facility] for tasks assessed/performed       Past Medical History:  Diagnosis Date  . Angina pectoris ([place]) 2017   ABNORMAL STRESS TEST CATH DONE AT [facility] RESULTS OK  . Cancer ([place]) 2000   Pre-esophogeal  . COPD (chronic obstructive pulmonary disease) ([place])   . Gout   . History of kidney stones   . Hyperlipidemia   . Muscle cramps at night   . Osteoarthrosis    OA  . Raynaud's disease    fingers  . Sleep apnea    NO CPAP USE LAST 6 MONTHS DUE TO IRRITATES NOSE    Past Surgical History:  Procedure Laterality Date  . CARDIAC CATHETERIZATION  2017 [facility]  . COLONOSCOPY    . surgery for barretts esophagus  2000  . surgery for kidney stone  2009 or 2010  . TOTAL KNEE ARTHROPLASTY Right 01/11/2018   Procedure: RIGHT TOTAL KNEE ARTHROPLASTY;  Surgeon: Gaynelle Arabian, MD;  Location: [facility];  Service: Orthopedics;  Laterality: Right;    There were no vitals filed for this visit.  Subjective Assessment - 02/08/18 1301    Subjective  Pt states that he feels like his knee is still swollen. He states that he's been  having to sleep in the recliner since last Friday.     Currently in Pain?  No/denies          [facility] Adult PT Treatment/Exercise - 02/08/18 0001      Exercises   Exercises  Knee/Hip      Knee/Hip Exercises: Stretches   Passive Hamstring Stretch  Right;3 reps;30 seconds    Passive Hamstring Stretch Limitations  standing, 12" step, +overpressure     Knee: Self-Stretch to increase Flexion  Right;10 seconds    Knee: Self-Stretch Limitations  knee drive 00F 10" on 12" step    Gastroc Stretch  Right;3 reps;30 seconds    Gastroc Stretch Limitations  slant board      Knee/Hip Exercises: Aerobic   Stationary Bike  x25mns seat 12, 1/2 and retro revolutions, for mobility      Knee/Hip Exercises: Standing   Terminal Knee Extension  Right;10 reps    Theraband Level (Terminal Knee Extension)  Level 2 (Red)    Terminal Knee Extension Limitations  10" holds    Hip Abduction  Both;2 sets;10 reps    Lateral Step Up  Right;10 reps;Step Height: 4";Hand Hold: 2    Lateral Step Up Limitations  cues for  TKE at top for knee extension      Knee/Hip Exercises: Seated   Other Seated Knee/Hip Exercises  R heel slides for knee flexion 10x10" holds      Knee/Hip Exercises: Supine   Knee Extension Limitations  7    Knee Flexion Limitations  106      Knee/Hip Exercises: Prone   Prone Knee Hang  4 minutes    Prone Knee Hang Weights (lbs)  with manual to HS to improve extension    Other Prone Exercises  prone TKE 10x3-5" holds      Manual Therapy   Manual Therapy  Edema management;Joint mobilization;Taping    Manual therapy comments  completed separate rest of treatment    Edema Management  retro massage with BLE elevated to reduce edema    Joint Mobilization  patellar mobs all directions and Grade III AP/PA tibiofemoral joint mobs for knee flexion and extension ROM    Kinesiotex  Edema      Kinesiotix   Edema  x2 strips (1 on thigh, 1 on anterior lower leg) to reduce edema             PT  Education - 02/08/18 1348    Education Details  continue HEP, will send off compressoin garment referral next visit if tape not helping    Person(s) Educated  Patient    Methods  Explanation;Demonstration    Comprehension  Verbalized understanding;Returned demonstration       PT Short Term Goals - 02/05/18 1501      PT SHORT TERM GOAL #1   Title  Pt will have reduced R knee joint line edema by 3cm or > in order to improved ROM and reduce pain.    Baseline  11/22: 37.75cm jiont line    Time  3    Period  Weeks    Status  On-going      PT SHORT TERM GOAL #2   Title  Pt will have improved R knee AROM from 8-100deg in order to maximize gait.     Baseline  11/22: 10-102deg    Time  3    Period  Weeks    Status  Partially Met      PT SHORT TERM GOAL #3   Title  Pt will be able to perform R SLS for 10 sec without UE support to maximize gait and demo improved functional strength.    Baseline  11/22: 43sec RLE    Time  3    Period  Weeks    Status  Achieved      PT SHORT TERM GOAL #4   Title  Pt will be able to perform 5xSTS in 12sec or < without UE and with proper form to demo improved balance and functional strength.     Baseline  11/22: 12.7sec, RLE slightly extended, no UE    Time  3    Period  Weeks    Status  On-going        PT Long Term Goals - 02/05/18 1502      PT LONG TERM GOAL #1   Title  Pt will have improved R knee AROM from 0-120deg in order to further maximize gait and improve stair ambulation.    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    Baseline  11/22: see MMT    Time  6    Period  Weeks    Status  On-going      PT LONG TERM GOAL #3   Title  Pt will ambulate at least 675f without AD and gait WNL during 3MWT in order to demo  improved endurance and strength to allow him to maximize his community access and return to PLOF.     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     Baseline  11/22: R: 43sec, L: 16sec    Time  6    Period  Weeks    Status  Partially Met            Plan - 02/08/18 1348    Clinical Impression Statement  Continued with established POC focusing on ROM and edema. Continued with TKE and resumed prone TKE for improved extension ROM. Did add hip abd and lateral step ups for strengthening in order to maximize gait and ROM. Ended with manual for joint mobs and edema to improve knee ROM. Re-applied K-tape for edema control; will send off referral next session if no real change noted or reported. AROM 7 to 106deg this date. Continue as planned, progressing as able.     Rehab Potential  Good    PT Frequency  3x / week    PT Treatment/Interventions  ADLs/Self Care Home Management;Aquatic Therapy;Cryotherapy;Electrical Stimulation;Moist Heat;Ultrasound;DME Instruction;Gait training;Stair training;Functional mobility training;Therapeutic activities;Therapeutic exercise;Balance training;Neuromuscular re-education;Patient/family education;Manual techniques;Orthotic Fit/Training;Scar mobilization;Passive range of motion;Dry needling;Energy conservation;Taping    PT Next Visit Plan  continue k-tape; send off referral for compression garment for edema if k-tape not helping; Continue focus on ROM and edema prior to strengthening until AROM normalized. Continue with half revolutions on bike and biodex for PROM. Continue with manual techniques to reduce scar tissue and edema.      PT Home Exercise Plan  eval: quad sets, heel slides,  seated HS stretch; 11/20: supine heel prop, SAQ, LAQ, SLR    Consulted and Agree with Plan of Care  Patient       Patient will benefit from skilled therapeutic intervention in order to improve the following deficits and impairments:  Abnormal gait, Decreased activity tolerance, Decreased balance, Decreased endurance, Decreased mobility, Decreased range of motion, Decreased scar mobility, Decreased strength, Difficulty walking, Hypomobility, Increased edema, Increased fascial restricitons, Increased muscle spasms, Impaired flexibility, Pain  Visit Diagnosis: Stiffness of right knee, not elsewhere classified  Localized edema  Difficulty in walking, not elsewhere classified     Problem List Patient Active Problem List   Diagnosis Date Noted  . OA (osteoarthritis) of knee 01/11/2018  . Gout 12/20/2015  . COPD (ch[place] obstructive pulmonary disease) ([place]  . Hyperlipidemia 12/20/2015  . Sleep apnea 12/20/2015  . [place] p[place]is ([place]/07[place]        BrGeraldine SolarT, DPT  [place]:  [phone]   Fax:  [phone]  Name: Jordan Roth MRN: [id number] Date of Birth: 05-Dec-1955

## 2018-02-10 ENCOUNTER — Ambulatory Visit (HOSPITAL_COMMUNITY): Payer: 59

## 2018-02-10 ENCOUNTER — Encounter (HOSPITAL_COMMUNITY): Payer: Self-pay

## 2018-02-10 DIAGNOSIS — M25661 Stiffness of right knee, not elsewhere classified: Secondary | ICD-10-CM

## 2018-02-10 DIAGNOSIS — R262 Difficulty in walking, not elsewhere classified: Secondary | ICD-10-CM

## 2018-02-10 DIAGNOSIS — R600 Localized edema: Secondary | ICD-10-CM

## 2018-02-10 NOTE — Therapy (Signed)
[place], [place], [address] Phone: [phone]   Fax:  [phone]  Physical Therapy Treatment  Patient Details  Name: Jordan Roth MRN: [id number] Date of Birth: 20-Dec-1955 Referring Provider (PT): Theresa Duty, PA-C (Surgeon: Gaynelle Arabian, MD)   Encounter Date: 02/10/2018  PT End of Session - 02/10/18 1257    Visit Number  12    Number of Visits  19    Date for PT Re-Evaluation  02/26/18   mini reassess completed on 02/05/18 (10th visit)   Authorization Type  [employer]    Authorization Time Period  01/15/18 to 02/26/18    Authorization - Visit Number  12    Authorization - Number of Visits  60   PT/OT/SLP combined   PT Start Time  0086    PT Stop Time  1345    PT Time Calculation (min)  46 min    Activity Tolerance  Patient tolerated treatment well    Behavior During Therapy  [facility] for tasks assessed/performed       Past Medical History:  Diagnosis Date  . Angina pectoris ([place]) 2017   ABNORMAL STRESS TEST CATH DONE AT [facility] RESULTS OK  . Cancer ([place]) 2000   Pre-esophogeal  . COPD (chronic obstructive pulmonary disease) ([place])   . Gout   . History of kidney stones   . Hyperlipidemia   . Muscle cramps at night   . Osteoarthrosis    OA  . Raynaud's disease    fingers  . Sleep apnea    NO CPAP USE LAST 6 MONTHS DUE TO IRRITATES NOSE    Past Surgical History:  Procedure Laterality Date  . CARDIAC CATHETERIZATION  2017 [facility]  . COLONOSCOPY    . surgery for barretts esophagus  2000  . surgery for kidney stone  2009 or 2010  . TOTAL KNEE ARTHROPLASTY Right 01/11/2018   Procedure: RIGHT TOTAL KNEE ARTHROPLASTY;  Surgeon: Gaynelle Arabian, MD;  Location: [facility];  Service: Orthopedics;  Laterality: Right;    There were no vitals filed for this visit.  Subjective Assessment - 02/10/18 1259    Subjective  Pt states that he is feeling a little tight at his HS which he thinks is just from  working it.     Currently in Pain?  No/denies          [facility] Adult PT Treatment/Exercise - 02/10/18 0001      Exercises   Exercises  Knee/Hip      Knee/Hip Exercises: Stretches   Passive Hamstring Stretch  Right;3 reps;30 seconds    Passive Hamstring Stretch Limitations  standing, 12" step, +overpressure     Knee: Self-Stretch to increase Flexion  Right;10 seconds    Knee: Self-Stretch Limitations  knee drive 76P 10" on 12" step +AP tibiofemoral joint mob for improved flexion    Gastroc Stretch  Right;3 reps;30 seconds    Gastroc Stretch Limitations  slant board      Knee/Hip Exercises: Aerobic   Stationary Bike  x21mns seat 12, 1/2 and retro revolutions, for mobility      Knee/Hip Exercises: Standing   Terminal Knee Extension  Right;10 reps    Theraband Level (Terminal Knee Extension)  Level 2 (Red)    Terminal Knee Extension Limitations  10" holds    Lateral Step Up  Right;10 reps;Step Height: 4";Hand Hold: 2    Lateral Step Up Limitations  cues for TKE at top for knee extension  Step Down  Right;10 reps;Step Height: 6";Hand Hold: 2    Step Down Limitations  to improve knee flexion    Other Standing Knee Exercises  TKE small pink ball against wall 10x10" holds      Knee/Hip Exercises: Supine   Knee Extension Limitations  6    Knee Flexion Limitations  107      Knee/Hip Exercises: Prone   Hamstring Curl  15 reps    Hamstring Curl Limitations  after prone knee hang    Prone Knee Hang  5 minutes    Prone Knee Hang Weights (lbs)  with manual to HS to improve extension and retro massage for edema          PT Education - 02/10/18 1258    Education Details  sent off referral for compresison garment and will continue to follow up; continue HEP, exercise technique    Person(s) Educated  Patient    Methods  Explanation;Demonstration    Comprehension  Returned demonstration;Verbalized understanding       PT Short Term Goals - 02/05/18 1501      PT SHORT TERM GOAL #1    Title  Pt will have reduced R knee joint line edema by 3cm or > in order to improved ROM and reduce pain.    Baseline  11/22: 37.75cm jiont line    Time  3    Period  Weeks    Status  On-going      PT SHORT TERM GOAL #2   Title  Pt will have improved R knee AROM from 8-100deg in order to maximize gait.     Baseline  11/22: 10-102deg    Time  3    Period  Weeks    Status  Partially Met      PT SHORT TERM GOAL #3   Title  Pt will be able to perform R SLS for 10 sec without UE support to maximize gait and demo improved functional strength.    Baseline  11/22: 43sec RLE    Time  3    Period  Weeks    Status  Achieved      PT SHORT TERM GOAL #4   Title  Pt will be able to perform 5xSTS in 12sec or < without UE and with proper form to demo improved balance and functional strength.     Baseline  11/22: 12.7sec, RLE slightly extended, no UE    Time  3    Period  Weeks    Status  On-going        PT Long Term Goals - 02/05/18 1502      PT LONG TERM GOAL #1   Title  Pt will have improved R knee AROM from 0-120deg in order to further maximize gait and improve stair ambulation.    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    Baseline  11/22: see MMT    Time  6    Period  Weeks    Status  On-going      PT LONG TERM GOAL #3   Title  Pt will ambulate at least 686f without AD and gait WNL during 3MWT in  order to demo improved endurance and strength to allow him to maximize his community access and return to PLOF.     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     Baseline  11/22: R: 43sec, L: 16sec    Time  6    Period  Weeks    Status  Partially Met            Plan - 02/10/18 1347    Clinical Impression Statement  Continued with established POC focusing on ROM and edema. Added standing TKE with small pink ball for quad strength to improve knee extension. Continued with prone knee hang +manual STM and retro massage to reduce restrictions and edema. Ended with joint mobs and retro massage to improve ROM and reduce swelling. AROM 6 to 107deg this date. PT sent off referral for compression garment on 02/09/18; will continue to f/u with this and give to pt once received so that he can go get fitted for a compression garment in order to reduce his edema. Pt's scar noted to be healing nicely with very minimal to no superficial openings noted today. Continue as planned, progressing as able.     Rehab Potential  Good    PT Frequency  3x / week    PT Treatment/Interventions  ADLs/Self Care Home Management;Aquatic Therapy;Cryotherapy;Electrical Stimulation;Moist Heat;Ultrasound;DME Instruction;Gait training;Stair training;Functional mobility training;Therapeutic activities;Therapeutic exercise;Balance training;Neuromuscular re-education;Patient/family education;Manual techniques;Orthotic Fit/Training;Scar mobilization;Passive range of motion;Dry needling;Energy conservation;Taping    PT Next Visit Plan  f/u on referral for compression garment for edema;; Continue focus on ROM and edema prior to strengthening until AROM normalized. Continue with half revolutions on bike and biodex for PROM. Continue with manual techniques to reduce scar tissue and edema.      PT Home Exercise Plan  eval: quad sets, heel slides, seated HS stretch; 11/20: supine heel prop, SAQ, LAQ, SLR    Consulted and Agree with Plan of Care  Patient       Patient will benefit from skilled therapeutic  intervention in order to improve the following deficits and impairments:  Abnormal gait, Decreased activity tolerance, Decreased balance, Decreased endurance, Decreased mobility, Decreased range of motion, Decreased scar mobility, Decreased strength, Difficulty walking, Hypomobility, Increased edema, Increased fascial restricitons, Increased muscle spasms, Impaired flexibility, Pain  Visit Diagnosis: Stiffness of right knee, not elsewhere classified  Localized edema  Difficulty in walking, not elsewhere classified     Problem List Patient Active Problem List   Diagnosis Date Noted  . OA (osteoarthritis) of knee 01/11/2018  . Gout 12/20/2015  . COPD (chronic obstructive pulmonary disease) ([place] 12/20/2015  . Hyperlipidemia 12/20/2015  . Sleep apnea 12/20/2015  . [place] p[place]is [place] 12/20/2015        BGeraldine SolarPT, DPT  [address] [address]Phone: [phone]  Fax:  [phone] Name: Jordan DOWEMRN: [id number] of Birth: [birth year]

## 2018-02-15 ENCOUNTER — Ambulatory Visit (HOSPITAL_COMMUNITY): Payer: 59 | Attending: Student

## 2018-02-15 ENCOUNTER — Encounter (HOSPITAL_COMMUNITY): Payer: 59 | Admitting: Physical Therapy

## 2018-02-15 ENCOUNTER — Encounter (HOSPITAL_COMMUNITY): Payer: Self-pay

## 2018-02-15 DIAGNOSIS — M25661 Stiffness of right knee, not elsewhere classified: Secondary | ICD-10-CM

## 2018-02-15 DIAGNOSIS — R262 Difficulty in walking, not elsewhere classified: Secondary | ICD-10-CM | POA: Diagnosis not present

## 2018-02-15 DIAGNOSIS — R600 Localized edema: Secondary | ICD-10-CM | POA: Insufficient documentation

## 2018-02-15 NOTE — Therapy (Signed)
[place], [place], [address] Phone: [phone]   Fax:  [phone]  Physical Therapy Treatment  Patient Details  Name: Jordan Roth MRN: [id number] Date of Birth: [birth year] Referring Provider (PT): Theresa Duty, PA-C (Surgeon: Gaynelle Arabian, MD)  # OF FEET WALKED: cueing for heel strike through session to improve gait mechanics ROM:  Flexion: 110 degrees             Extension: 6 degrees    Encounter Date: 02/15/2018  PT End of Session - 02/15/18 0958    Visit Number  13    Number of Visits  19    Date for PT Re-Evaluation  02/26/18   minireassess complete visit #10 on 02/05/18   Authorization Type  [employer]    Authorization - Visit Number  13    Authorization - Number of Visits  60   PT/OT/SLP combined   PT Start Time  0945   4' on bike, not included with charges   PT Stop Time  1037    PT Time Calculation (min)  52 min    Activity Tolerance  Patient tolerated treatment well    Behavior During Therapy  [facility] for tasks assessed/performed       Past Medical History:  Diagnosis Date  . Angina pectoris ([place]) 2017   ABNORMAL STRESS TEST CATH DONE AT [facility] RESULTS OK  . Cancer ([place]) 2000   Pre-esophogeal  . COPD (chronic obstructive pulmonary disease) ([place])   . Gout   . History of kidney stones   . Hyperlipidemia   . Muscle cramps at night   . Osteoarthrosis    OA  . Raynaud's disease    fingers  . Sleep apnea    NO CPAP USE LAST 6 MONTHS DUE TO IRRITATES NOSE    Past Surgical History:  Procedure Laterality Date  . CARDIAC CATHETERIZATION  2017 [facility]  . COLONOSCOPY    . surgery for barretts esophagus  2000  . surgery for kidney stone  2009 or 2010  . TOTAL KNEE ARTHROPLASTY Right 01/11/2018   Procedure: RIGHT TOTAL KNEE ARTHROPLASTY;  Surgeon: Gaynelle Arabian, MD;  Location: [facility];  Service: Orthopedics;  Laterality: Right;    There were no vitals filed for this  visit.  Subjective Assessment - 02/15/18 0957    Subjective  Pt stated he is feeling good today, no reports of pain just tightness in HS    Patient Stated Goals  get back to like I was    Currently in Pain?  No/denies         [facility] PT Assessment - 02/15/18 0001      Assessment   Medical Diagnosis  R TKA    Referring Provider (PT)  Theresa Duty, PA-C   Surgeon: Gaynelle Arabian, MD   Onset Date/Surgical Date  01/11/18    Next MD Visit  02/16/2018    Prior Therapy  none      Circumferential Edema   Circumferential - Right  37.5cm, joint line   was 37.75     AROM   Right Knee Extension  7   was 10   Right Knee Flexion  110   was 102     Standardized Balance Assessment   Five times sit to stand comments   11.43"  no UE, Rt LE slightly extended    was 12.7 no UE, Rt LE slightly extended  [place] Adult PT Treatment/Exercise - 02/15/18 0001      Knee/Hip Exercises: Stretches   Passive Hamstring Stretch  Right;3 reps;30 seconds    Passive Hamstring Stretch Limitations  standing, 12" step, +overpressure     Knee: Self-Stretch to increase Flexion  Right;10 seconds    Knee: Self-Stretch Limitations  knee drive 74B 10" on 12" step +AP tibiofemoral joint mob for improved flexion    Gastroc Stretch  Right;3 reps;30 seconds    Gastroc Stretch Limitations  slant board      Knee/Hip Exercises: Aerobic   Stationary Bike  x31mns seat 11 full forward revolution wiht cueing to reduce compensation      Knee/Hip Exercises: Standing   Terminal Knee Extension  Right;10 reps    Theraband Level (Terminal Knee Extension)  Level 4 (Blue)    Terminal Knee Extension Limitations  10" holds    Other Standing Knee Exercises  TKE small pink ball against wall 10x10" holds      Knee/Hip Exercises: Supine   Short Arc [employer] 15 reps    Short Arc Quad Sets Limitations  5" holds    Knee Extension  AROM    Knee Extension Limitations  6    Knee Flexion  AROM    Knee  Flexion Limitations  110   was 107 last session     Knee/Hip Exercises: Prone   Prone Knee Hang  5 minutes    Prone Knee Hang Weights (lbs)  with manual to HS to improve extension and retro massage for edema      Manual Therapy   Manual Therapy  Edema management;Joint mobilization;Taping    Manual therapy comments  completed separate rest of treatment    Edema Management  retro massage with BLE elevated to reduce edema    Joint Mobilization  patellar mobs all directions and Grade III AP/PA tibiofemoral joint mobs for knee flexion and extension ROM    Myofascial Release  proximal scar, VLO, VMO, and HS to reduce restrictions and improve ROM               PT Short Term Goals - 02/15/18 04496     PT SHORT TERM GOAL #1   Title  Pt will have reduced R knee joint line edema by 3cm or > in order to improved ROM and reduce pain.    Baseline  12/02: 37.5 joint line; 11/22: 37.75cm joint line; 11/01: was 40cm initial eval    Time  3    Period  Weeks    Status  On-going      PT SHORT TERM GOAL #2   Title  Pt will have improved R knee AROM from 8-100deg in order to maximize gait.     Baseline  12/02: 6-110 degrees 11/22: 10-102deg    Status  Achieved      PT SHORT TERM GOAL #3   Title  Pt will be able to perform R SLS for 10 sec without UE support to maximize gait and demo improved functional strength.    Baseline  11/22: 43sec RLE    Status  Achieved      PT SHORT TERM GOAL #4   Title  Pt will be able to perform 5xSTS in 12sec or < without UE and with proper form to demo improved balance and functional strength.     Baseline  12/02: 5 STS 11.43" with Rt LE slightly extended, no  11/22: 12.7sec, RLE slightly extended, no UE  Status  Partially Met        PT Long Term Goals - 02/15/18 1603      PT LONG TERM GOAL #1   Title  Pt will have improved R knee AROM from 0-120deg in order to further maximize gait and improve stair ambulation.    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    Baseline  11/22: see MMT    Status  On-going      PT LONG TERM GOAL #3   Title  Pt will ambulate at least 61f without AD and gait WNL during 3MWT in order to demo improved endurance and strength to allow him to maximize his community access and return to PLOF.     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     Baseline  11/22: R: 43sec, L: 16sec            Plan - 02/15/18 1549    Clinical Impression Statement  Received signed order for compression hose, given to pt and educated on proper wear time and places for purchase with verbalized understanding.  Continued session focus with knee mobility and quad strengthening.  Pt with MD apt tomorrow so reviewed goals as well.  Pt making improvements with flexion but continues to be limited with extension so increased focus on extension.  Pt with antalgic gait mechanics, educated importance of heel strike to initiate that knee extension, pt c/o tightness in hamstring.  Manual STM complete to address medial hamstring restrictions as well as manual retrograde massage to address edema present proximal knee.  AROM 6-110 degrees. Edema reduced to 37.5cm following manual (was 37.75cm on 02/05/18).  No reports of increased pain through session.      Rehab Potential  Good    PT Frequency  3x / week    PT Duration  6 weeks    PT Treatment/Interventions  ADLs/Self Care Home Management;Aquatic Therapy;Cryotherapy;Electrical Stimulation;Moist Heat;Ultrasound;DME Instruction;Gait  training;Stair training;Functional mobility training;Therapeutic activities;Therapeutic exercise;Balance training;Neuromuscular re-education;Patient/family education;Manual techniques;Orthotic Fit/Training;Scar mobilization;Passive range of motion;Dry needling;Energy conservation;Taping    PT Next Visit Plan  F/U wiht MD apt; Continue focus on ROM and edema prior to strengthening until AROM normalized. Continue with half revolutions on bike and biodex for PROM. Continue with manual techniques to reduce scar tissue and edema.      PT Home Exercise Plan  eval: quad sets, heel slides, seated HS stretch; 11/20: supine heel prop, SAQ, LAQ, SLR       Patient will benefit from skilled therapeutic intervention in order to improve the following deficits and impairments:  Abnormal gait, Decreased activity tolerance, Decreased balance, Decreased endurance, Decreased mobility, Decreased range of motion, Decreased scar mobility, Decreased strength, Difficulty walking, Hypomobility, Increased edema, Increased fascial restricitons, Increased muscle spasms, Impaired flexibility, Pain  Visit Diagnosis: Stiffness of right knee, not elsewhere classified  Localized edema  Difficulty  in walking, not elsewhere classified     Problem List Patient Active Problem List   Diagnosis Date Noted  . OA (osteoarthritis) of knee 01/11/2018  . Gout 12/20/2015  . COPD (chronic obstructive pulmonary disease) ([place]) 12/20/2015  . Hyperlipidemia 12/20/2015  . Sleep apnea 12/20/2015  . Angina pectoris [facility]) 12/20/2015   Ihor [place], [place]; [facility]  Aldona Lento 02/15/2018, 4:05 PM  [place], [place], [address] Phone: [phone]   Fax:  [phone]  Name: Jordan Roth MRN: [id number] Date of Birth: 1955/12/22

## 2018-02-16 ENCOUNTER — Encounter (HOSPITAL_COMMUNITY): Payer: 59 | Admitting: Physical Therapy

## 2018-02-16 DIAGNOSIS — Z96651 Presence of right artificial knee joint: Secondary | ICD-10-CM | POA: Diagnosis not present

## 2018-02-16 DIAGNOSIS — Z471 Aftercare following joint replacement surgery: Secondary | ICD-10-CM | POA: Diagnosis not present

## 2018-02-17 ENCOUNTER — Encounter (HOSPITAL_COMMUNITY): Payer: 59

## 2018-02-18 ENCOUNTER — Ambulatory Visit (HOSPITAL_COMMUNITY): Payer: 59

## 2018-02-18 ENCOUNTER — Telehealth (HOSPITAL_COMMUNITY): Payer: Self-pay | Admitting: Internal Medicine

## 2018-02-18 NOTE — Telephone Encounter (Signed)
02/18/18  pt left a message to cx no reason was given

## 2018-02-19 ENCOUNTER — Encounter (HOSPITAL_COMMUNITY): Payer: 59

## 2018-02-22 ENCOUNTER — Encounter (HOSPITAL_COMMUNITY): Payer: 59 | Admitting: Physical Therapy

## 2018-02-22 ENCOUNTER — Ambulatory Visit (HOSPITAL_COMMUNITY): Payer: 59

## 2018-02-22 DIAGNOSIS — R262 Difficulty in walking, not elsewhere classified: Secondary | ICD-10-CM

## 2018-02-22 DIAGNOSIS — M25661 Stiffness of right knee, not elsewhere classified: Secondary | ICD-10-CM | POA: Diagnosis not present

## 2018-02-22 DIAGNOSIS — R600 Localized edema: Secondary | ICD-10-CM

## 2018-02-22 NOTE — Therapy (Signed)
[place], [place], [address] Phone: [phone]   Fax:  [phone]  Physical Therapy Treatment  Patient Details  Name: Jordan Roth MRN: [id number] Date of Birth: Oct 17, 1955 Referring Provider (PT): Theresa Duty, PA-C (Surgeon: Gaynelle Arabian, MD)   Encounter Date: 02/22/2018  PT End of Session - 02/22/18 1258    Visit Number  14    Number of Visits  19    Date for PT Re-Evaluation  02/26/18   minireassess complete visit #10 on 02/05/18   Authorization Type  [employer]    Authorization - Visit Number  14    Authorization - Number of Visits  60   PT/OT/SLP combined   PT Start Time  1300    PT Stop Time  1347    PT Time Calculation (min)  47 min    Activity Tolerance  Patient tolerated treatment well    Behavior During Therapy  [facility] for tasks assessed/performed       Past Medical History:  Diagnosis Date  . Angina pectoris ([place]) 2017   ABNORMAL STRESS TEST CATH DONE AT [facility] RESULTS OK  . Cancer ([place]) 2000   Pre-esophogeal  . COPD (chronic obstructive pulmonary disease) ([place])   . Gout   . History of kidney stones   . Hyperlipidemia   . Muscle cramps at night   . Osteoarthrosis    OA  . Raynaud's disease    fingers  . Sleep apnea    NO CPAP USE LAST 6 MONTHS DUE TO IRRITATES NOSE    Past Surgical History:  Procedure Laterality Date  . CARDIAC CATHETERIZATION  2017 [facility]  . COLONOSCOPY    . surgery for barretts esophagus  2000  . surgery for kidney stone  2009 or 2010  . TOTAL KNEE ARTHROPLASTY Right 01/11/2018   Procedure: RIGHT TOTAL KNEE ARTHROPLASTY;  Surgeon: Gaynelle Arabian, MD;  Location: [facility];  Service: Orthopedics;  Laterality: Right;    There were no vitals filed for this visit.  Subjective Assessment - 02/22/18 1258    Subjective  Doesn't really have pain per se, just stiffness in back of knee by hamstrings.    Patient Stated Goals  get back to like I was    Currently  in Pain?  No/denies                       [facility] Adult PT Treatment/Exercise - 02/22/18 0001      Knee/Hip Exercises: Stretches   Passive Hamstring Stretch  Right;3 reps;30 seconds    Passive Hamstring Stretch Limitations  standing, 12" step, +overpressure     Knee: Self-Stretch to increase Flexion  Right;10 seconds    Knee: Self-Stretch Limitations  knee drive 70W 10" on 12" step +AP tibiofemoral joint mob for improved flexion    Gastroc Stretch  Right;3 reps;30 seconds    Gastroc Stretch Limitations  slant board      Knee/Hip Exercises: Standing   Terminal Knee Extension  Right;10 reps    Theraband Level (Terminal Knee Extension)  Level 4 (Blue)    Terminal Knee Extension Limitations  10" holds    Lateral Step Up  Right;1 set;10 reps;Step Height: 6"    Lateral Step Up Limitations  cues for TKE at top for knee extension    Step Down  Right;10 reps;Step Height: 6";Hand Hold: 2    Step Down Limitations  to improve knee flexion    Other  Standing Knee Exercises  TKE small pink ball against wall 10x10" holds      Knee/Hip Exercises: Seated   Long Arc Quad  Right;1 set;10 reps   5" hold     Knee/Hip Exercises: Supine   Short Arc Quad Sets  15 reps    Short Arc Quad Sets Limitations  5" holds    Knee Extension  AROM    Knee Extension Limitations  13    Knee Flexion  AROM    Knee Flexion Limitations  107      Knee/Hip Exercises: Prone   Prone Knee Hang  5 minutes    Prone Knee Hang Weights (lbs)  with manual to HS to improve extension and retro massage for edema      Manual Therapy   Manual Therapy  Edema management;Joint mobilization    Manual therapy comments  completed separate rest of treatment    Edema Management  retro massage with BLE elevated to reduce edema    Joint Mobilization  patellar mobs all directions and Grade III AP/PA tibiofemoral joint mobs for knee flexion and extension ROM    Myofascial Release  proximal scar, VLO, VMO, and HS to reduce  restrictions and improve ROM             PT Education - 02/22/18 1830    Education Details  Discussed purpose and technique of interventions throughout session.     Person(s) Educated  Patient    Methods  Explanation    Comprehension  Verbalized understanding       PT Short Term Goals - 02/15/18 0958      PT SHORT TERM GOAL #1   Title  Pt will have reduced R knee joint line edema by 3cm or > in order to improved ROM and reduce pain.    Baseline  12/02: 37.5 joint line; 11/22: 37.75cm joint line; 11/01: was 40cm initial eval    Time  3    Period  Weeks    Status  On-going      PT SHORT TERM GOAL #2   Title  Pt will have improved R knee AROM from 8-100deg in order to maximize gait.     Baseline  12/02: 6-110 degrees 11/22: 10-102deg    Status  Achieved      PT SHORT TERM GOAL #3   Title  Pt will be able to perform R SLS for 10 sec without UE support to maximize gait and demo improved functional strength.    Baseline  11/22: 43sec RLE    Status  Achieved      PT SHORT TERM GOAL #4   Title  Pt will be able to perform 5xSTS in 12sec or < without UE and with proper form to demo improved balance and functional strength.     Baseline  12/02: 5 STS 11.43" with Rt LE slightly extended, no  11/22: 12.7sec, RLE slightly extended, no UE    Status  Partially Met        PT Long Term Goals - 02/15/18 1603      PT LONG TERM GOAL #1   Title  Pt will have improved R knee AROM from 0-120deg in order to further maximize gait and improve stair ambulation.    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    Baseline  11/22: see MMT    Status  On-going      PT LONG TERM GOAL #3   Title  Pt will ambulate at least 679f without AD and gait WNL during 3MWT in order to  demo improved endurance and strength to allow him to maximize his community access and return to PLOF.     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     Baseline  11/22: R: 43sec, L: 16sec            Plan - 02/22/18 1258    Clinical Impression Statement  Continued session focus with knee mobility, hamstring and calf elongation and quad strengthening. Patient's AROM decreased from 6-110 last session to 13-107 at end of this session. Pt with antalgic gait mechanics, educated importance of heel strike to initiate that knee extension and knee flexion in swing through phase of gait. Pt continues to c/o tightness in distal hamstring. Manual STM complete to address medial hamstring restrictions as well as manual retrograde massage and scar tissue myofascial release to address edema and restricted motion at incision site present proximal knee. Continue with current program, focus on AROM, follow up on purchase and use of compression stocking. Patient did not wear one to today's session    Rehab Potential  Good    PT Frequency  3x / week    PT Duration  6 weeks    PT Treatment/Interventions  ADLs/Self Care Home Management;Aquatic Therapy;Cryotherapy;Electrical Stimulation;Moist Heat;Ultrasound;DME Instruction;Gait training;Stair training;Functional mobility training;Therapeutic activities;Therapeutic exercise;Balance training;Neuromuscular re-education;Patient/family education;Manual techniques;Orthotic Fit/Training;Scar mobilization;Passive range of motion;Dry needling;Energy conservation;Taping    PT Next Visit Plan  F/U wiht MD apt; Continue focus on ROM and edema prior to strengthening until AROM normalized. Continue with half revolutions on bike and  biodex for PROM. Continue with manual techniques to reduce scar tissue and edema.  Follow up on purchase and use of compression stockings for edema control.    PT Home Exercise Plan  eval: quad sets, heel slides, seated HS stretch; 11/20: supine heel prop, SAQ, LAQ, SLR       Patient will benefit from skilled therapeutic intervention in order to improve the following deficits and impairments:  Abnormal gait, Decreased activity tolerance, Decreased balance, Decreased endurance, Decreased mobility, Decreased range of motion, Decreased scar mobility, Decreased strength, Difficulty walking, Hypomobility, Increased edema, Increased fascial restricitons, Increased muscle spasms, Impaired flexibility, Pain  Visit Diagnosis: Stiffness of right knee, not elsewhere classified  Localized edema  Difficulty in walking, not elsewhere classified     Problem List Patient Active Problem List   Diagnosis Date Noted  . OA (osteoarthritis) of knee 01/11/2018  . Gout 12/20/2015  . [place]  . Hyperlipidemia 12/20/2015  . Sleep apnea 12/20/2015  . [place]    BaFloria RavelingHartnett-Rands, MS, PT Per [place]#[id number]/11/2017, 6:38 PM  [place]hone: [phone] Fax:  [phone]  Name: TRENDEN HAZELRIGG MRN: [id number] Date of Birth: Jul 26, 1955

## 2018-02-24 ENCOUNTER — Encounter (HOSPITAL_COMMUNITY): Payer: 59

## 2018-02-24 ENCOUNTER — Telehealth (HOSPITAL_COMMUNITY): Payer: Self-pay

## 2018-02-24 NOTE — Telephone Encounter (Signed)
Patient states he ask Jordan Roth to cx this apptment and MD said he was to be scheduled for 4 more weeks.

## 2018-02-25 ENCOUNTER — Telehealth (HOSPITAL_COMMUNITY): Payer: Self-pay | Admitting: Physical Therapy

## 2018-02-25 ENCOUNTER — Encounter (HOSPITAL_COMMUNITY): Payer: Self-pay

## 2018-02-25 ENCOUNTER — Ambulatory Visit (HOSPITAL_COMMUNITY): Payer: 59

## 2018-02-25 DIAGNOSIS — R600 Localized edema: Secondary | ICD-10-CM

## 2018-02-25 DIAGNOSIS — M25661 Stiffness of right knee, not elsewhere classified: Secondary | ICD-10-CM

## 2018-02-25 DIAGNOSIS — R262 Difficulty in walking, not elsewhere classified: Secondary | ICD-10-CM

## 2018-02-25 NOTE — Therapy (Signed)
[place], [place], [address] Phone: [phone]   Fax:  [phone]   Progress Note Reporting Period 02/05/18 to 02/25/18  See note below for Objective Data and Assessment of Progress/Goals.   Physical Therapy Treatment  Patient Details  Name: Jordan Roth MRN: [id number] Date of Birth: Nov 01, 1955 Referring Provider (PT): Theresa Duty, PA-C (Surgeon: Gaynelle Arabian, MD)   Encounter Date: 02/25/2018  PT End of Session - 02/25/18 1343    Visit Number  15    Number of Visits  27    Date for PT Re-Evaluation  03/25/18   minireassess complete visit #15 on 02/25/18   Authorization Type  [employer]    Authorization Time Period  01/15/18 to 02/26/18; new: 02/25/18 TO 03/25/18    Authorization - Visit Number  15    Authorization - Number of Visits  60   PT/OT/SLP combined   PT Start Time  1343    PT Stop Time  1425    PT Time Calculation (min)  42 min    Activity Tolerance  Patient tolerated treatment well    Behavior During Therapy  [facility] for tasks assessed/performed       Past Medical History:  Diagnosis Date  . Angina pectoris ([place]) 2017   ABNORMAL STRESS TEST CATH DONE AT [facility] RESULTS OK  . Cancer ([place]) 2000   Pre-esophogeal  . COPD (chronic obstructive pulmonary disease) ([place])   . Gout   . History of kidney stones   . Hyperlipidemia   . Muscle cramps at night   . Osteoarthrosis    OA  . Raynaud's disease    fingers  . Sleep apnea    NO CPAP USE LAST 6 MONTHS DUE TO IRRITATES NOSE    Past Surgical History:  Procedure Laterality Date  . CARDIAC CATHETERIZATION  2017 [facility]  . COLONOSCOPY    . surgery for barretts esophagus  2000  . surgery for kidney stone  2009 or 2010  . TOTAL KNEE ARTHROPLASTY Right 01/11/2018   Procedure: RIGHT TOTAL KNEE ARTHROPLASTY;  Surgeon: Gaynelle Arabian, MD;  Location: [facility];  Service: Orthopedics;  Laterality: Right;    There were no vitals filed for this  visit.  Subjective Assessment - 02/25/18 1343    Subjective  Pt reports his knee is tight but not as tight.     Patient Stated Goals  get back to like I was    Currently in Pain?  No/denies         [facility] PT Assessment - 02/25/18 0001      Assessment   Medical Diagnosis  R TKA    Referring Provider (PT)  Theresa Duty, PA-C   Surgeon: Gaynelle Arabian, MD   Onset Date/Surgical Date  01/11/18    Next MD Visit  02/16/2018    Prior Therapy  none      Circumferential Edema   Circumferential - Right  37.5cm, joint line   was 37.5cm, jiont line last reassessment; 40cm joint at eval     AROM   Right Knee Extension  10   was 7   Right Knee Flexion  115   was 110     Strength   Right Hip Extension  5/5   was 4+   Right Hip ABduction  5/5   was 4+   Left Hip Extension  5/5   was 4+   Left Hip ABduction  5/5   was 4+  Right Knee Flexion  4+/5   was 4+   Right Knee Extension  5/5   was 4+     Ambulation/Gait   Ambulation Distance (Feet)  898 Feet   3MWT; was 779f   Assistive device  None    Gait Pattern  Step-through pattern   slightly premature PF on R compared to L     Balance   Balance Assessed  Yes      Static Standing Balance   Static Standing - Balance Support  No upper extremity supported    Static Standing Balance -  Activities   Single Leg Stance - Right Leg;Single Leg Stance - Left Leg    Static Standing - Comment/# of Minutes  L: 30sec      Standardized Balance Assessment   Standardized Balance Assessment  Five Times Sit to Stand    Five times sit to stand comments   9sec, slight weight shift off RLE but overall much improved            [facility] Adult PT Treatment/Exercise - 02/25/18 0001      Knee/Hip Exercises: Aerobic   Stationary Bike  x455ms seat 11 full forward revolution wiht cueing to reduce compensation      Knee/Hip Exercises: Supine   Quad Sets  Right;10 reps    Quad Sets Limitations  +over pressure from PT x5" holds    Other Supine  Knee/Hip Exercises  supine heel prop 3# x4m72m for extension      Knee/Hip Exercises: Prone   Contract/Relax to Increase Flexion  3x10" holds            PT Education - 02/25/18 1343    Education Details  reassessment findings, continue HEP    Person(s) Educated  Patient    Methods  Explanation;Demonstration    Comprehension  Verbalized understanding;Returned demonstration       PT Short Term Goals - 02/25/18 1346      PT SHORT TERM GOAL #1   Title  Pt will have reduced R knee joint line edema by 3cm or > in order to improved ROM and reduce pain.    Baseline  12/12: 37.5 joint line    Time  3    Period  Weeks    Status  Partially Met      PT SHORT TERM GOAL #2   Title  Pt will have improved R knee AROM from 8-100deg in order to maximize gait.     Baseline  12/12: 12-112    Status  Partially Met      PT SHORT TERM GOAL #3   Title  Pt will be able to perform R SLS for 10 sec without UE support to maximize gait and demo improved functional strength.    Baseline  11/22: 43sec RLE    Status  Achieved      PT SHORT TERM GOAL #4   Title  Pt will be able to perform 5xSTS in 12sec or < without UE and with proper form to demo improved balance and functional strength.     Baseline  12/12: 9sec, slightly off-shift from RLE    Status  Partially Met        PT Long Term Goals - 02/25/18 1346      PT LONG TERM GOAL #1   Title  Pt will have improved R knee AROM from 0-120deg in order to further maximize gait and improve stair ambulation.    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    Baseline  12/12: all 5/5 except R HS 4+/5    Status  Achieved      PT LONG TERM GOAL #3   Title  Pt will ambulate at least 658f without AD and gait WNL during 3MWT in order to demo improved  endurance and strength to allow him to maximize his community access and return to PLOF.     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     Baseline  12/12: 30sec or > BLE    Status  Achieved            Plan - 02/25/18 1428    Clinical Impression Statement  PT reassessed pt's goals and outcome measures this date. Pt has made good progress towards goals as illustrated above. His main limitation is his AROM and swelling as he is 10-115deg (by EOS) and he continues with palpable knots/taut bands in R HS. This PT will contact his surgeon to get clearance for dry needling as this PT feels this pt would greatly benefit from trigger point dry needling to reduce the restrictions in the HS and potentially gastroc in order to improve his knee extension ROM. Pt needs continued therapy to address remaining deficits in order to maximize ROM and return to PLOF.     Rehab Potential  Good    PT Frequency  3x / week    PT Duration  6 weeks    PT Treatment/Interventions  ADLs/Self Care Home Management;Aquatic Therapy;Cryotherapy;Electrical Stimulation;Moist Heat;Ultrasound;DME Instruction;Gait training;Stair training;Functional mobility training;Therapeutic activities;Therapeutic exercise;Balance training;Neuromuscular re-education;Patient/family education;Manual techniques;Orthotic Fit/Training;Scar mobilization;Passive range of motion;Dry needling;Energy conservation;Taping    PT Next Visit Plan  Dry needle R HS if MD clears; Continue focus on ROM and edema prior to strengthening until AROM normalized. Continue with half revolutions on bike and biodex for PROM. Continue with manual techniques to reduce scar tissue and edema.  Follow up on purchase and use of  compression stockings for edema control.    PT Home Exercise Plan  eval: quad sets, heel slides, seated HS stretch; 11/20: supine heel prop, SAQ, LAQ, SLR    Consulted and Agree with Plan of Care  Patient       Patient will benefit from skilled therapeutic intervention in order to improve the following deficits and impairments:  Abnormal gait, Decreased activity tolerance, Decreased balance, Decreased endurance, Decreased mobility, Decreased range of motion, Decreased scar mobility, Decreased strength, Difficulty walking, Hypomobility, Increased edema, Increased fascial restricitons, Increased muscle spasms, Impaired flexibility, Pain  Visit Diagnosis: Stiffness of right knee, not elsewhere classified - Plan: PT plan of care cert/re-cert  Localized edema - Plan: PT plan of care cert/re-cert  Difficulty in walking, not elsewhere classified - Plan: PT plan of care cert/re-cert     Problem List Patient Active Problem List   Diagnosis Date Noted  . OA (osteoarthritis) of knee 01/11/2018  . Gout  12/20/2015  . COPD (chronic obstructive pulmonary disease) ([place]) 12/20/2015  . Hyperlipidemia 12/20/2015  . Sleep apnea 12/20/2015  . Angina pectoris ([place]) 12/20/2015       Geraldine Solar PT, DPT  [place] [address] [place], [place], [address] Phone: [phone]   Fax:  [phone]  Name: Jordan Roth MRN: [id number] Date of Birth: 06-09-1955

## 2018-02-25 NOTE — Telephone Encounter (Signed)
APPT is to late

## 2018-02-26 ENCOUNTER — Encounter (HOSPITAL_COMMUNITY): Payer: 59

## 2018-02-26 ENCOUNTER — Encounter (HOSPITAL_COMMUNITY): Payer: 59 | Admitting: Physical Therapy

## 2018-03-01 ENCOUNTER — Encounter (HOSPITAL_COMMUNITY): Payer: Self-pay

## 2018-03-01 ENCOUNTER — Ambulatory Visit (HOSPITAL_COMMUNITY): Payer: 59

## 2018-03-01 DIAGNOSIS — M25661 Stiffness of right knee, not elsewhere classified: Secondary | ICD-10-CM | POA: Diagnosis not present

## 2018-03-01 DIAGNOSIS — R600 Localized edema: Secondary | ICD-10-CM

## 2018-03-01 DIAGNOSIS — R262 Difficulty in walking, not elsewhere classified: Secondary | ICD-10-CM

## 2018-03-01 NOTE — Therapy (Addendum)
[place], [place], [address] Phone: [phone]   Fax:  [phone]  Physical Therapy Treatment  Patient Details  Name: Jordan Roth MRN: [id number] Date of Birth: 02/14/1956 Referring Provider (PT): Theresa Duty, PA-C (Surgeon: Gaynelle Arabian, MD)   Encounter Date: 03/01/2018  PT End of Session - 03/01/18 1257    Visit Number  16    Number of Visits  27    Date for PT Re-Evaluation  03/25/18   minireassess complete visit #15 on 02/25/18   Authorization Type  [employer]    Authorization Time Period  01/15/18 to 02/26/18; new: 02/25/18 TO 03/25/18    Authorization - Visit Number  16    Authorization - Number of Visits  60   PT/OT/SLP combined   PT Start Time  1258    PT Stop Time  1340    PT Time Calculation (min)  42 min    Activity Tolerance  Patient tolerated treatment well    Behavior During Therapy  [facility] for tasks assessed/performed       Past Medical History:  Diagnosis Date  . Angina pectoris ([place]) 2017   ABNORMAL STRESS TEST CATH DONE AT [facility] RESULTS OK  . Cancer ([place]) 2000   Pre-esophogeal  . COPD (chronic obstructive pulmonary disease) ([place])   . Gout   . History of kidney stones   . Hyperlipidemia   . Muscle cramps at night   . Osteoarthrosis    OA  . Raynaud's disease    fingers  . Sleep apnea    NO CPAP USE LAST 6 MONTHS DUE TO IRRITATES NOSE    Past Surgical History:  Procedure Laterality Date  . CARDIAC CATHETERIZATION  2017 [facility]  . COLONOSCOPY    . surgery for barretts esophagus  2000  . surgery for kidney stone  2009 or 2010  . TOTAL KNEE ARTHROPLASTY Right 01/11/2018   Procedure: RIGHT TOTAL KNEE ARTHROPLASTY;  Surgeon: Gaynelle Arabian, MD;  Location: [facility];  Service: Orthopedics;  Laterality: Right;    There were no vitals filed for this visit.  Subjective Assessment - 03/01/18 1257    Subjective  Pt states that his knee is feeling pretty good. He has been  sleeping pretty well the last few nights.    Patient Stated Goals  get back to like I was    Currently in Pain?  No/denies            [facility] Adult PT Treatment/Exercise - 03/01/18 0001      Exercises   Exercises  Knee/Hip      Knee/Hip Exercises: Stretches   Passive Hamstring Stretch  Right;3 reps;30 seconds    Passive Hamstring Stretch Limitations  standing, 12" step, +overpressure     Knee: Self-Stretch to increase Flexion  Right;5 reps;10 seconds    Knee: Self-Stretch Limitations  knee drive 5J88" on 12" step    Gastroc Stretch  Right;3 reps;30 seconds    Gastroc Stretch Limitations  slant board      Knee/Hip Exercises: Aerobic   Stationary Bike  x3 mins, seat 10, fwd revolutions for ROM      Knee/Hip Exercises: Standing   Lateral Step Up  Right;10 reps;Step Height: 6"    Lateral Step Up Limitations  cues for TKE at top for knee extension    Forward Step Up  Right;10 reps;Step Height: 6"    Step Down  Right;10 reps;Step Height: 6";Hand Hold: 2  Step Down Limitations  to improve knee flexion    SLS with Vectors  RLE x5RT    Other Standing Knee Exercises  TKE small pink ball against wall 10x10" holds      Knee/Hip Exercises: Supine   Knee Extension Limitations  7    Knee Flexion Limitations  118    Other Supine Knee/Hip Exercises  supine HS MET 2x3RT      Knee/Hip Exercises: Prone   Hamstring Curl  15 reps    Hamstring Curl Limitations  3#    Prone Knee Hang  5 minutes    Prone Knee Hang Weights (lbs)  with manual to HS to improve extension and retro massage for edema    Other Prone Exercises  prone TKE 15reps x3-5" holds with 3# for proprioception and strength      Manual Therapy   Manual Therapy  Joint mobilization;Soft tissue mobilization;Myofascial release    Manual therapy comments  completed separate rest of treatment    Joint Mobilization  Grade III-IV AP, PA joint mobs for knee flexion and extension ROM    Soft tissue mobilization  STM to R medial and  lateral HS to reduce restricitons and improve ROM    Myofascial Release  MFR to R medial and lateral HS to reduce restricitons and improve ROM             PT Education - 03/01/18 1257    Education Details  Dr. Wynelle Link didn't clear to DN so will increase focus on manual STM/MFR for HS restrictions    Person(s) Educated  Patient    Methods  Explanation;Demonstration    Comprehension  Verbalized understanding;Returned demonstration       PT Short Term Goals - 02/25/18 1346      PT SHORT TERM GOAL #1   Title  Pt will have reduced R knee joint line edema by 3cm or > in order to improved ROM and reduce pain.    Baseline  12/12: 37.5 joint line    Time  3    Period  Weeks    Status  Partially Met      PT SHORT TERM GOAL #2   Title  Pt will have improved R knee AROM from 8-100deg in order to maximize gait.     Baseline  12/12: 12-112    Status  Partially Met      PT SHORT TERM GOAL #3   Title  Pt will be able to perform R SLS for 10 sec without UE support to maximize gait and demo improved functional strength.    Baseline  11/22: 43sec RLE    Status  Achieved      PT SHORT TERM GOAL #4   Title  Pt will be able to perform 5xSTS in 12sec or < without UE and with proper form to demo improved balance and functional strength.     Baseline  12/12: 9sec, slightly off-shift from RLE    Status  Partially Met        PT Long Term Goals - 02/25/18 1346      PT LONG TERM GOAL #1   Title  Pt will have improved R knee AROM from 0-120deg in order to further maximize gait and improve stair ambulation.    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    Baseline  12/12: all 5/5 except R HS 4+/5    Status  Achieved      PT LONG TERM GOAL #3   Title  Pt will ambulate at least 640f without  AD and gait WNL during 3MWT in order to demo improved endurance and strength to allow him to maximize his community access and return to PLOF.     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     Baseline  12/12: 30sec or > BLE    Status  Achieved            Plan - 03/01/18 1342    Clinical Impression Statement  Dr. AlAnne Fuurse called this clinic back regarding PT's inquiring for dry needling pt's R HS and he said that the pt was not far enough along in his post-op yet and to not perform dry needling yet. Continued with focus on ROM, mostly extension. Continued with step training, TKE with small pink ball, and added SLS for quad strength this date. Also initiated supine HS MET to improve HS length. Ended with manual for joint mobility and STM/MFR to R medial HS to reduce restrictions and improve overall ROM. Multiple taut bands and knots noted throughout medial and lateral HS which were tender to palpation. AROM 7-118deg this date; was 10-115deg at his reassessment last week. Continue as planned, progressing as able.    Rehab Potential  Good    PT Frequency  3x / week    PT Duration  6 weeks    PT Treatment/Interventions  ADLs/Self Care Home Management;Aquatic Therapy;Cryotherapy;Electrical Stimulation;Moist Heat;Ultrasound;DME Instruction;Gait training;Stair training;Functional mobility training;Therapeutic activities;Therapeutic exercise;Balance training;Neuromuscular re-education;Patient/family education;Manual techniques;Orthotic Fit/Training;Scar mobilization;Passive range of motion;Dry needling;Energy conservation;Taping    PT Next Visit Plan  STM/MFR to R HS; Continue focus on ROM and edema prior to strengthening until AROM normalized.  Continue with half revolutions on bike and biodex for PROM. Continue with manual techniques to reduce scar tissue and edema.  Follow up on purchase and use of compression stockings for edema control.    PT Home Exercise Plan  eval: quad sets, heel slides, seated HS stretch; 11/20: supine heel prop, SAQ, LAQ, SLR    Consulted and Agree with Plan of Care  Patient       Patient will benefit from skilled therapeutic intervention in order to improve the following deficits and impairments:  Abnormal gait, Decreased activity tolerance, Decreased balance, Decreased endurance, Decreased mobility, Decreased range of motion, Decreased scar mobility, Decreased strength, Difficulty walking, Hypomobility, Increased edema, Increased fascial restricitons, Increased muscle spasms, Impaired flexibility, Pain  Visit Diagnosis: Stiffness of right knee, not elsewhere classified  Localized edema  Difficulty in walking, not elsewhere classified     Problem List Patient Active Problem List   Diagnosis Date Noted  . OA (osteoarthritis) of knee 01/11/2018  . Gout 12/20/2015  . [place]  . Hyperlipidemia 12/20/2015  . Sleep apnea 12/20/2015  . [place]        BrGeraldine SolarT, DPT  [place], [place], [address] Phone: [phone]   Fax:  [phone]  Name: Jordan Roth MRN: [id number] Date of Birth: 09/07/1955

## 2018-03-03 ENCOUNTER — Ambulatory Visit (HOSPITAL_COMMUNITY): Payer: 59 | Admitting: Physical Therapy

## 2018-03-03 DIAGNOSIS — M25661 Stiffness of right knee, not elsewhere classified: Secondary | ICD-10-CM

## 2018-03-03 DIAGNOSIS — R262 Difficulty in walking, not elsewhere classified: Secondary | ICD-10-CM

## 2018-03-03 DIAGNOSIS — R600 Localized edema: Secondary | ICD-10-CM

## 2018-03-03 NOTE — Therapy (Signed)
[place], [place], [address] Phone: [phone]   Fax:  [phone]  Physical Therapy Treatment  Patient Details  Name: Jordan Roth MRN: [id number] Date of Birth: 1955/11/01 Referring Provider (PT): Theresa Duty, PA-C (Surgeon: Gaynelle Arabian, MD)   Encounter Date: 03/03/2018  PT End of Session - 03/03/18 1446    Visit Number  17    Number of Visits  27    Date for PT Re-Evaluation  03/25/18   minireassess complete visit #15 on 02/25/18   Authorization Type  [employer]    Authorization Time Period  01/15/18 to 02/26/18; new: 02/25/18 TO 03/25/18    Authorization - Visit Number  17    Authorization - Number of Visits  60   PT/OT/SLP combined   PT Start Time  1431    PT Stop Time  1515    PT Time Calculation (min)  44 min    Activity Tolerance  Patient tolerated treatment well    Behavior During Therapy  [facility] for tasks assessed/performed       Past Medical History:  Diagnosis Date  . Angina pectoris ([place]) 2017   ABNORMAL STRESS TEST CATH DONE AT [facility] RESULTS OK  . Cancer ([place]) 2000   Pre-esophogeal  . COPD (chronic obstructive pulmonary disease) ([place])   . Gout   . History of kidney stones   . Hyperlipidemia   . Muscle cramps at night   . Osteoarthrosis    OA  . Raynaud's disease    fingers  . Sleep apnea    NO CPAP USE LAST 6 MONTHS DUE TO IRRITATES NOSE    Past Surgical History:  Procedure Laterality Date  . CARDIAC CATHETERIZATION  2017 [facility]  . COLONOSCOPY    . surgery for barretts esophagus  2000  . surgery for kidney stone  2009 or 2010  . TOTAL KNEE ARTHROPLASTY Right 01/11/2018   Procedure: RIGHT TOTAL KNEE ARTHROPLASTY;  Surgeon: Gaynelle Arabian, MD;  Location: [facility];  Service: Orthopedics;  Laterality: Right;    There were no vitals filed for this visit.  Subjective Assessment - 03/03/18 1443    Subjective  pt reports he was a little sore after his last session but all  better now.  No pain or issues.  STates he's been moving things at work.    Currently in Pain?  No/denies                       [facility] Adult PT Treatment/Exercise - 03/03/18 0001      Knee/Hip Exercises: Stretches   Passive Hamstring Stretch  Right;3 reps;30 seconds    Passive Hamstring Stretch Limitations  standing, 12" step, +overpressure     Knee: Self-Stretch to increase Flexion  Right;5 reps;10 seconds    Knee: Self-Stretch Limitations  knee drive 6Z99" on 12" step    Gastroc Stretch  Right;3 reps;30 seconds    Gastroc Stretch Limitations  slant board      Knee/Hip Exercises: Aerobic   Stationary Bike  x3 mins, seat 10, fwd revolutions for ROM      Knee/Hip Exercises: Standing   Lateral Step Up  Right;Step Height: 6";15 reps    Lateral Step Up Limitations  cues for TKE at top for knee extension    Forward Step Up  Right;Step Height: 6";15 reps    Step Down  Right;Step Height: 6";Hand Hold: 2;15 reps    Step Down Limitations  to improve knee flexion    SLS with Vectors  BLE x10RT 5" holds with 1 HHA      Knee/Hip Exercises: Prone   Prone Knee Hang  5 minutes    Prone Knee Hang Weights (lbs)  with manual to HS to improve extension and retro massage for edema    Other Prone Exercises  prone TKE 15reps x3-5" holds with 3# for proprioception and strength      Manual Therapy   Manual Therapy  Joint mobilization;Soft tissue mobilization;Myofascial release    Manual therapy comments  completed separate rest of treatment    Edema Management  retro massage with BLE elevated to reduce edema    Joint Mobilization  Grade III-IV AP, PA joint mobs for knee flexion and extension ROM    Soft tissue mobilization  STM to R medial and lateral HS to reduce restricitons and improve ROM    Myofascial Release  MFR to R medial and lateral HS to reduce restricitons and improve ROM               PT Short Term Goals - 02/25/18 1346      PT SHORT TERM GOAL #1   Title  Pt  will have reduced R knee joint line edema by 3cm or > in order to improved ROM and reduce pain.    Baseline  12/12: 37.5 joint line    Time  3    Period  Weeks    Status  Partially Met      PT SHORT TERM GOAL #2   Title  Pt will have improved R knee AROM from 8-100deg in order to maximize gait.     Baseline  12/12: 12-112    Status  Partially Met      PT SHORT TERM GOAL #3   Title  Pt will be able to perform R SLS for 10 sec without UE support to maximize gait and demo improved functional strength.    Baseline  11/22: 43sec RLE    Status  Achieved      PT SHORT TERM GOAL #4   Title  Pt will be able to perform 5xSTS in 12sec or < without UE and with proper form to demo improved balance and functional strength.     Baseline  12/12: 9sec, slightly off-shift from RLE    Status  Partially Met        PT Long Term Goals - 02/25/18 1346      PT LONG TERM GOAL #1   Title  Pt will have improved R knee AROM from 0-120deg in order to further maximize gait and improve stair ambulation.    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    Baseline  12/12: all 5/5 except R HS 4+/5    Status  Achieved      PT LONG TERM GOAL #3   Title  Pt will ambulate at least 675f without AD and gait WNL during 3MWT in order to demo improved endurance and strength to allow him to maximize his community access and return to PLOF.     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     Baseline  12/12: 30sec or > BLE    Status  Achieved            Plan -  03/03/18 1520    Clinical Impression Statement  continued with primary focus on improving knee extension.  continued with therex and manual in both prone and supine to encourage relaxation and increased ROM.  NOted weakness in Rt quadricep as well.  Encourged to complete more knee extension targeting VMO to improve this.  Multiple knots and tight bands persist throughout hamstring and proximal gastroc.      Rehab Potential  Good    PT Frequency  3x / week    PT Duration  6 weeks    PT Treatment/Interventions  ADLs/Self Care Home Management;Aquatic Therapy;Cryotherapy;Electrical Stimulation;Moist Heat;Ultrasound;DME Instruction;Gait training;Stair training;Functional mobility training;Therapeutic activities;Therapeutic exercise;Balance training;Neuromuscular re-education;Patient/family education;Manual techniques;Orthotic Fit/Training;Scar mobilization;Passive range of motion;Dry needling;Energy conservation;Taping    PT Next Visit Plan  STM/MFR to R HS; Continue focus on ROM and edema prior to strengthening until AROM normalized. Continue with half revolutions on bike and biodex for PROM. Continue with manual techniques to reduce scar tissue and edema.  Follow up on purchase and use of compression stockings for edema control.    PT Home Exercise Plan  eval: quad sets, heel slides, seated HS stretch; 11/20: supine heel prop, SAQ, LAQ, SLR    Consulted and Agree with Plan of Care  Patient       Patient will benefit from skilled therapeutic intervention in order to improve the following deficits and impairments:  Abnormal gait, Decreased activity tolerance, Decreased balance, Decreased endurance, Decreased mobility, Decreased range of motion, Decreased scar mobility, Decreased strength, Difficulty walking, Hypomobility, Increased edema, Increased fascial restricitons, Increased muscle spasms, Impaired flexibility, Pain  Visit Diagnosis: Stiffness of right knee, not elsewhere classified  Localized  edema  Difficulty in walking, not elsewhere classified     Problem List Patient Active Problem List   Diagnosis Date Noted  . OA (osteoarthritis) of knee 01/11/2018  . Gout 12/20/2015  . COPD (chronic obstructive pulmonary disease) ([place]) 12/20/2015  . Hyperlipidemia 12/20/2015  . Sleep apnea 12/20/2015  . Angina pectoris ([place]) 12/20/2015   Teena Irani, PTA/CLT [phone]  Teena Irani 03/03/2018, 3:22 PM  [place] [address] [place], [place], [address] Phone: [phone]   Fax:  [phone]  Name: KERRY CHISOLM MRN: [id number] Date of Birth: [birth year]

## 2018-03-08 ENCOUNTER — Encounter (HOSPITAL_COMMUNITY): Payer: Self-pay | Admitting: Physical Therapy

## 2018-03-08 ENCOUNTER — Ambulatory Visit (HOSPITAL_COMMUNITY): Payer: 59 | Admitting: Physical Therapy

## 2018-03-08 DIAGNOSIS — R262 Difficulty in walking, not elsewhere classified: Secondary | ICD-10-CM

## 2018-03-08 DIAGNOSIS — M25661 Stiffness of right knee, not elsewhere classified: Secondary | ICD-10-CM

## 2018-03-08 DIAGNOSIS — R600 Localized edema: Secondary | ICD-10-CM

## 2018-03-08 NOTE — Therapy (Signed)
[facility] [place], [place], [address] Phone: [phone]   Fax:  [phone]  Physical Therapy Treatment  Patient Details  Name: Jordan Roth MRN: [id number] Date of Birth: September 16, 1955 Referring Provider (PT): Theresa Duty, PA-C (Surgeon: Gaynelle Arabian, MD)   Encounter Date: 03/08/2018  PT End of Session - 03/08/18 1504    Visit Number  18    Number of Visits  27    Date for PT Re-Evaluation  03/25/18   minireassess complete visit #15 on 02/25/18   Authorization Type  [employer]    Authorization Time Period  01/15/18 to 02/26/18; new: 02/25/18 TO 03/25/18    Authorization - Visit Number  18    Authorization - Number of Visits  60   PT/OT/SLP combined   PT Start Time  1430    PT Stop Time  1515    PT Time Calculation (min)  45 min    Activity Tolerance  Patient tolerated treatment well    Behavior During Therapy  [facility] for tasks assessed/performed       Past Medical History:  Diagnosis Date  . Angina pectoris ([place]) 2017   ABNORMAL STRESS TEST CATH DONE AT [facility] RESULTS OK  . Cancer ([place]) 2000   Pre-esophogeal  . COPD (chronic obstructive pulmonary disease) ([place])   . Gout   . History of kidney stones   . Hyperlipidemia   . Muscle cramps at night   . Osteoarthrosis    OA  . Raynaud's disease    fingers  . Sleep apnea    NO CPAP USE LAST 6 MONTHS DUE TO IRRITATES NOSE    Past Surgical History:  Procedure Laterality Date  . CARDIAC CATHETERIZATION  2017 [facility]  . COLONOSCOPY    . surgery for barretts esophagus  2000  . surgery for kidney stone  2009 or 2010  . TOTAL KNEE ARTHROPLASTY Right 01/11/2018   Procedure: RIGHT TOTAL KNEE ARTHROPLASTY;  Surgeon: Gaynelle Arabian, MD;  Location: [facility];  Service: Orthopedics;  Laterality: Right;    There were no vitals filed for this visit.  Subjective Assessment - 03/08/18 1427    Subjective  PT states that he has no pain.  He is still having trouble  sleeping .    Limitations  Other (comment)   sleeping    How long can you sit comfortably?  as long as he wants     How long can you stand comfortably?  no problem     How long can you walk comfortably?  no problem     Currently in Pain?  No/denies            [facility] Adult PT Treatment/Exercise - 03/08/18 0001      Exercises   Exercises  Knee/Hip      Knee/Hip Exercises: Stretches   Active Hamstring Stretch  Right;3 reps;30 seconds    Knee: Self-Stretch to increase Flexion  Right;3 reps;30 seconds    Gastroc Stretch  Right;3 reps;30 seconds    Gastroc Stretch Limitations  slant board      Knee/Hip Exercises: Standing   Heel Raises  Both;10 reps    Knee Flexion  Right;10 reps    Terminal Knee Extension  Right;10 reps    Rocker Board  2 minutes    Rocker Board Limitations  ant/post and RT/LT       Knee/Hip Exercises: Supine   Quad Sets  Right;10 reps    Short Arc [employer]  Sets  AROM;10 reps    Knee Extension  PROM    Knee Extension Limitations  12 AROM    Knee Flexion  PROM    Knee Flexion Limitations  120      Knee/Hip Exercises: Prone   Other Prone Exercises  prone TKE 15reps x3-5" holds with 3# for proprioception and strength      Manual Therapy   Manual Therapy  Soft tissue mobilization    Soft tissue mobilization  STM to R medial and lateral HS to reduce restricitons and improve ROM               PT Short Term Goals - 02/25/18 1346      PT SHORT TERM GOAL #1   Title  Pt will have reduced R knee joint line edema by 3cm or > in order to improved ROM and reduce pain.    Baseline  12/12: 37.5 joint line    Time  3    Period  Weeks    Status  Partially Met      PT SHORT TERM GOAL #2   Title  Pt will have improved R knee AROM from 8-100deg in order to maximize gait.     Baseline  12/12: 12-112    Status  Partially Met      PT SHORT TERM GOAL #3   Title  Pt will be able to perform R SLS for 10 sec without UE support to maximize gait and demo improved  functional strength.    Baseline  11/22: 43sec RLE    Status  Achieved      PT SHORT TERM GOAL #4   Title  Pt will be able to perform 5xSTS in 12sec or < without UE and with proper form to demo improved balance and functional strength.     Baseline  12/12: 9sec, slightly off-shift from RLE    Status  Partially Met        PT Long Term Goals - 02/25/18 1346      PT LONG TERM GOAL #1   Title  Pt will have improved R knee AROM from 0-120deg in order to further maximize gait and improve stair ambulation.    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    Baseline  12/12: all 5/5 except R HS 4+/5    Status  Achieved      PT LONG TERM GOAL #3   Title  Pt will ambulate at least 645f without AD and gait WNL during 3MWT in order to demo improved endurance and strength to allow him to maximize his community access and return to PLOF.     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     Baseline  12/12: 30sec or > BLE    Status  Achieved            Plan - 03/08/18 1504  Clinical Impression Statement  PT has lost extension in his knee.  Flexion is at 120 therefore treatment focused more on gaining extension than flexion.  PT at work and is not having any functional difficulty but gait is affected by lack of exension which will ultimately cause pain in pt hip and back.      Rehab Potential  Good    PT Frequency  3x / week    PT Duration  6 weeks    PT Treatment/Interventions  ADLs/Self Care Home Management;Aquatic  Therapy;Cryotherapy;Electrical Stimulation;Moist Heat;Ultrasound;DME Instruction;Gait training;Stair training;Functional mobility training;Therapeutic activities;Therapeutic exercise;Balance training;Neuromuscular re-education;Patient/family education;Manual techniques;Orthotic Fit/Training;Scar mobilization;Passive range of motion;Dry needling;Energy conservation;Taping    PT Next Visit Plan  focus on extension.     PT Home Exercise Plan  eval: quad sets, heel slides, seated HS stretch; 11/20: supine heel prop, SAQ, LAQ, SLR    Consulted and Agree with Plan of Care  Patient       Patient will benefit from skilled therapeutic intervention in order to improve the following deficits and impairments:  Abnormal gait, Decreased activity tolerance, Decreased balance, Decreased endurance, Decreased mobility, Decreased range of motion, Decreased scar mobility, Decreased strength, Difficulty walking, Hypomobility, Increased edema, Increased fascial restricitons, Increased muscle spasms, Impaired flexibility, Pain  Visit Diagnosis: Stiffness of right knee, not elsewhere classified  Localized edema  Difficulty in walking, not elsewhere classified     Problem List Patient Active Problem List   Diagnosis Date Noted  . OA (osteoarthritis) of knee 01/11/2018  . Gout 12/20/2015  . COPD (chronic obstructive pulmonary disease) ([place]) 12/20/2015  . Hyperlipidemia 12/20/2015  . Sleep apnea 12/20/2015  . Angina pectoris [facility]) 12/20/2015    Rayetta Humphrey, PT CLT [phone] 03/08/2018, 3:19 PM  [facility] [address] [place], [place], [address] Phone: [phone]   Fax:  [phone]  Name: Jordan Roth MRN: [id number] Date of Birth: September 29, 1955

## 2018-03-12 ENCOUNTER — Encounter (HOSPITAL_COMMUNITY): Payer: 59

## 2018-03-15 ENCOUNTER — Ambulatory Visit (HOSPITAL_COMMUNITY): Payer: 59 | Admitting: Physical Therapy

## 2018-03-15 DIAGNOSIS — R600 Localized edema: Secondary | ICD-10-CM

## 2018-03-15 DIAGNOSIS — R262 Difficulty in walking, not elsewhere classified: Secondary | ICD-10-CM

## 2018-03-15 DIAGNOSIS — M25661 Stiffness of right knee, not elsewhere classified: Secondary | ICD-10-CM

## 2018-03-15 NOTE — Therapy (Signed)
Jordan Roth, [place], [address] Phone: [phone]   Fax:  [phone]  Physical Therapy Treatment  Patient Details  Name: Jordan Roth MRN: [id number] Date of Birth: 08-26-1955 Referring Provider (PT): Jordan Duty, PA-C (Surgeon: Jordan Arabian, MD)   Encounter Date: 03/15/2018  PT End of Session - 03/15/18 1517    Visit Number  19    Number of Visits  27    Date for PT Re-Evaluation  03/25/18   minireassess complete visit #15 on 02/25/18   Authorization Type  [employer]    Authorization Time Period  01/15/18 to 02/26/18; new: 02/25/18 TO 03/25/18    Authorization - Visit Number  92    Authorization - Number of Visits  60   PT/OT/SLP combined   PT Start Time  1436    PT Stop Time  1517    PT Time Calculation (min)  41 min    Activity Tolerance  Patient tolerated treatment well    Behavior During Therapy  [facility] for tasks assessed/performed       Past Medical History:  Diagnosis Date  . Angina pectoris ([place]) 2017   ABNORMAL STRESS TEST CATH DONE AT [facility] RESULTS OK  . Cancer ([place]) 2000   Pre-esophogeal  . COPD (chronic obstructive pulmonary disease) ([place])   . Gout   . History of kidney stones   . Hyperlipidemia   . Muscle cramps at night   . Osteoarthrosis    OA  . Raynaud's disease    fingers  . Sleep apnea    NO CPAP USE LAST 6 MONTHS DUE TO IRRITATES NOSE    Past Surgical History:  Procedure Laterality Date  . CARDIAC CATHETERIZATION  2017 [facility]  . COLONOSCOPY    . surgery for barretts esophagus  2000  . surgery for kidney stone  2009 or 2010  . TOTAL KNEE ARTHROPLASTY Right 01/11/2018   Procedure: RIGHT TOTAL KNEE ARTHROPLASTY;  Surgeon: Jordan Arabian, MD;  [facility]: [facility];  Service: Orthopedics;  Laterality: Right;    There were no vitals filed for this visit.  Subjective Assessment - 03/15/18 1439    Subjective  PT states that he has no pain.  He is starting to sleep better.       Limitations  Other (comment)   sleeping    How long can you sit comfortably?  as long as he wants     How long can you stand comfortably?  no problem     How long can you walk comfortably?  no problem     Currently in Pain?  No/denies              [facility] Adult PT Treatment/Exercise - 03/15/18 0001      Exercises   Exercises  Knee/Hip      Knee/Hip Exercises: Stretches   Active Hamstring Stretch  Right;3 reps;30 seconds    Passive Hamstring Stretch  Right;3 reps;30 seconds    Knee: Self-Stretch to increase Flexion  --    Gastroc Stretch  Right;3 reps;30 seconds    Gastroc Stretch Limitations  slant board      Knee/Hip Exercises: Aerobic   Stationary Bike  8'      Knee/Hip Exercises: Standing   Heel Raises  Both;10 reps    Knee Flexion  --    Terminal Knee Extension  Right;15 reps    Rocker Board  2 minutes    Rocker Board Limitations  ant/post  and RT/LT       Knee/Hip Exercises: Supine   Quad Sets  Right;10 reps    Short Arc Quad Sets  AROM;10 reps    Knee Extension  PROM    Knee Extension Limitations  13 AROM    Knee Flexion  PROM    Knee Flexion Limitations  122 AROM       Knee/Hip Exercises: Prone   Hamstring Curl  15 reps    Prone Knee Hang  --   10 minutes    Other Prone Exercises  prone TKE 15reps x3-5" holds with 3# for proprioception and strength      Manual Therapy   Manual Therapy  Soft tissue mobilization    Soft tissue mobilization  STM to R medial and lateral HS to reduce restricitons and improve ROM               PT Short Term Goals - 02/25/18 1346      PT SHORT TERM GOAL #1   Title  Pt will have reduced R knee joint line edema by 3cm or > in order to improved ROM and reduce pain.    Baseline  12/12: 37.5 joint line    Time  3    Period  Weeks    Status  Partially Met      PT SHORT TERM GOAL #2   Title  Pt will have improved R knee AROM from 8-100deg in order to maximize gait.     Baseline  12/12: 12-112    Status   Partially Met      PT SHORT TERM GOAL #3   Title  Pt will be able to perform R SLS for 10 sec without UE support to maximize gait and demo improved functional strength.    Baseline  11/22: 43sec RLE    Status  Achieved      PT SHORT TERM GOAL #4   Title  Pt will be able to perform 5xSTS in 12sec or < without UE and with proper form to demo improved balance and functional strength.     Baseline  12/12: 9sec, slightly off-shift from RLE    Status  Partially Met        PT Long Term Goals - 02/25/18 1346      PT LONG TERM GOAL #1   Title  Pt will have improved R knee AROM from 0-120deg in order to further maximize gait and improve stair ambulation.    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    Baseline  12/12: all 5/5 except R HS 4+/5    Status  Achieved      PT LONG TERM GOAL #3   Title  Pt will ambulate at least 682f without AD and gait WNL during 3MWT in order to demo improved endurance and strength to allow him to maximize his community access and return to PLOF.     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     Baseline  12/12: 30sec or > BLE    Status  Achieved            Plan - 03/15/18 1517    Clinical Impression Statement  PT continues to lose extension.  Emphasized the importance of working on extension at home.  Soft tissue massage to relax tight hamstrings which is prohibiting full extension.  PT edema and pain  is decreasing     Rehab Potential  Good    PT Frequency  3x / week    PT Duration  6 weeks    PT  Treatment/Interventions  ADLs/Self Care Home Management;Aquatic Therapy;Cryotherapy;Electrical Stimulation;Moist Heat;Ultrasound;DME Instruction;Gait training;Stair training;Functional mobility training;Therapeutic activities;Therapeutic exercise;Balance training;Neuromuscular re-education;Patient/family education;Manual techniques;Orthotic Fit/Training;Scar mobilization;Passive range of motion;Dry needling;Energy conservation;Taping    PT Next Visit Plan  focus on extension.     PT Home Exercise Plan  eval: quad sets, heel slides, seated HS stretch; 11/20: supine heel prop, SAQ, LAQ, SLR    Consulted and Agree with Plan of Care  Patient       Patient will benefit from skilled therapeutic intervention in order to improve the following deficits and impairments:  Abnormal gait, Decreased activity tolerance, Decreased balance, Decreased endurance, Decreased mobility, Decreased range of motion, Decreased scar mobility, Decreased strength, Difficulty walking, Hypomobility, Increased edema, Increased fascial restricitons, Increased muscle spasms, Impaired flexibility, Pain  Visit Diagnosis: Stiffness of right knee, not elsewhere classified  Localized edema  Difficulty in walking, not elsewhere classified     Problem List Patient Active Problem List   Diagnosis Date Noted  . OA (osteoarthritis) of knee 01/11/2018  . Gout 12/20/2015  . COPD (chronic obstructive pulmonary disease) ([place]) 12/20/2015  . Hyperlipidemia 12/20/2015  . Sleep apnea 12/20/2015  . Angina pectoris [facility]) 12/20/2015   Rayetta Humphrey, PT CLT [phone] 03/15/2018, 3:19 PM  [facility] [address] [place], [place], [address] Phone: [phone]   Fax:  [phone]  Name: ZYHEIR DAFT MRN: [id number] Date of Birth: Jul 02, 1955

## 2018-03-19 ENCOUNTER — Ambulatory Visit (HOSPITAL_COMMUNITY): Payer: BLUE CROSS/BLUE SHIELD | Attending: Student

## 2018-03-19 ENCOUNTER — Encounter (HOSPITAL_COMMUNITY): Payer: Self-pay

## 2018-03-19 DIAGNOSIS — M25661 Stiffness of right knee, not elsewhere classified: Secondary | ICD-10-CM | POA: Diagnosis not present

## 2018-03-19 DIAGNOSIS — R600 Localized edema: Secondary | ICD-10-CM | POA: Insufficient documentation

## 2018-03-19 DIAGNOSIS — R262 Difficulty in walking, not elsewhere classified: Secondary | ICD-10-CM | POA: Diagnosis not present

## 2018-03-19 NOTE — Therapy (Signed)
[facility], [place], [address] Phone: [phone]   Fax:  [phone]  Physical Therapy Treatment  Patient Details  Name: Jordan Roth MRN: [id number] Date of Birth: June 25, 1955 Referring Provider (PT): Theresa Duty, PA-C (Surgeon: Gaynelle Arabian, MD)   Encounter Date: 03/19/2018  PT End of Session - 03/19/18 1300    Visit Number  20    Number of Visits  27    Date for PT Re-Evaluation  03/25/18   minireassess complete visit #15 on 02/25/18   Authorization Type  [employer]    Authorization Time Period  01/15/18 to 02/26/18; new: 02/25/18 TO 03/25/18    Authorization - Visit Number  20    Authorization - Number of Visits  60   PT/OT/SLP combined   PT Start Time  1300    PT Stop Time  1342    PT Time Calculation (min)  42 min    Activity Tolerance  Patient tolerated treatment well    Behavior During Therapy  [facility] for tasks assessed/performed       Past Medical History:  Diagnosis Date  . Angina pectoris ([place]) 2017   ABNORMAL STRESS TEST CATH DONE AT [facility] RESULTS OK  . Cancer ([place]) 2000   Pre-esophogeal  . COPD (chronic obstructive pulmonary disease) ([place])   . Gout   . History of kidney stones   . Hyperlipidemia   . Muscle cramps at night   . Osteoarthrosis    OA  . Raynaud's disease    fingers  . Sleep apnea    NO CPAP USE LAST 6 MONTHS DUE TO IRRITATES NOSE    Past Surgical History:  Procedure Laterality Date  . CARDIAC CATHETERIZATION  2017 [facility]  . COLONOSCOPY    . surgery for barretts esophagus  2000  . surgery for kidney stone  2009 or 2010  . TOTAL KNEE ARTHROPLASTY Right 01/11/2018   Procedure: RIGHT TOTAL KNEE ARTHROPLASTY;  Surgeon: Gaynelle Arabian, MD;  [facility];  Service: Orthopedics;  Laterality: Right;    There were no vitals filed for this visit.  Subjective Assessment - 03/19/18 1300    Subjective  Pt states that he is doing well. No pain.     Limitations  Other  (comment)   sleeping    How long can you sit comfortably?  as long as he wants     How long can you stand comfortably?  no problem     How long can you walk comfortably?  no problem     Currently in Pain?  No/denies           [facility] PT Assessment - 03/19/18 0001      Assessment   Medical Diagnosis  R TKA    Referring Provider (PT)  Theresa Duty, PA-C   Surgeon: Gaynelle Arabian, MD   Onset Date/Surgical Date  01/11/18    Next MD Visit  03/23/18    Prior Therapy  none        [facility] Adult PT Treatment/Exercise - 03/19/18 0001      Knee/Hip Exercises: Stretches   Active Hamstring Stretch  Right;3 reps;30 seconds    Gastroc Stretch  Right;3 reps;30 seconds    Gastroc Stretch Limitations  slant board      Knee/Hip Exercises: Standing   Terminal Knee Extension  Right;15 reps    Terminal Knee Extension Limitations  10" holds, small pink ball      Knee/Hip Exercises: Supine  Quad Sets  Right;10 reps    [employer] Limitations  +over pressure from PT     Other Supine Knee/Hip Exercises  supine heel prop x8 mins +manual       Knee/Hip Exercises: Prone   Prone Knee Hang Limitations  x38mns +manual to HS and gastroc      Manual Therapy   Manual Therapy  Joint mobilization;Soft tissue mobilization;Myofascial release    Manual therapy comments  completed separate rest of treatment    Joint Mobilization  Grade IV tibial ER joint mobs in end range extension to improve R knee extension; R great toe extension joint mobs and R ankle joint mobs AP for dorsiflexion    Soft tissue mobilization  STM during prone knee hang, supine heel prop to bil HS and R quads and proximal scar    Myofascial Release  MFR during prone knee hang, supine heel prop to HS, gastroc, quad, scar during supine heel prop and prone knee hang            PT Education - 03/19/18 1300    Education Details  exercise technique, continue HEP; add weight to LAQ and continue supine acitve HS stretch    Person(s) Educated   Patient    Methods  Explanation;Demonstration    Comprehension  Verbalized understanding;Returned demonstration       PT Short Term Goals - 02/25/18 1346      PT SHORT TERM GOAL #1   Title  Pt will have reduced R knee joint line edema by 3cm or > in order to improved ROM and reduce pain.    Baseline  12/12: 37.5 joint line    Time  3    Period  Weeks    Status  Partially Met      PT SHORT TERM GOAL #2   Title  Pt will have improved R knee AROM from 8-100deg in order to maximize gait.     Baseline  12/12: 12-112    Status  Partially Met      PT SHORT TERM GOAL #3   Title  Pt will be able to perform R SLS for 10 sec without UE support to maximize gait and demo improved functional strength.    Baseline  11/22: 43sec RLE    Status  Achieved      PT SHORT TERM GOAL #4   Title  Pt will be able to perform 5xSTS in 12sec or < without UE and with proper form to demo improved balance and functional strength.     Baseline  12/12: 9sec, slightly off-shift from RLE    Status  Partially Met        PT Long Term Goals - 02/25/18 1346      PT LONG TERM GOAL #1   Title  Pt will have improved R knee AROM from 0-120deg in order to further maximize gait and improve stair ambulation.    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    Baseline  12/12: all 5/5 except R HS 4+/5    Status  Achieved      PT LONG TERM GOAL #3   Title  Pt will ambulate at least 6063fwithout AD and gait  WNL during 3MWT in order to demo improved endurance and strength to allow him to maximize his community access and return to PLOF.     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     Baseline  12/12: 30sec or > BLE    Status  Achieved            Plan - 03/19/18 1353    Clinical Impression Statement  Continued with focus on R knee extension ROM. Performed manual to R HS medial and lateral as well as proximal gastroc during prone knee hand and during supine heel prop with manual to quads as well. Added R great toe extension, R ankle DF, and R tibial ER joint mobs this date, all to improve extension ROM. Ankle DF noted to be very stiff as well as his tibial ER mobility. AROM 12-121deg this date. He stated that he feels a pulling behind his knee when he tries to extend his knee; feel the tightness in his HS is one of the major contributing factors to his lack of extension, but his joint mobility deficits noted above are also likely contributing.     Rehab Potential  Good    PT Frequency  3x / week    PT Duration  6 weeks    PT Treatment/Interventions  ADLs/Self Care Home Management;Aquatic Therapy;Cryotherapy;Electrical Stimulation;Moist Heat;Ultrasound;DME Instruction;Gait training;Stair training;Functional mobility training;Therapeutic activities;Therapeutic exercise;Balance training;Neuromuscular re-education;Patient/family education;Manual techniques;Orthotic Fit/Training;Scar mobilization;Passive range of motion;Dry needling;Energy conservation;Taping    PT Next Visit Plan  continue focus on extension, great toe extension, R ankle DF    PT Home Exercise Plan  eval: quad sets, heel slides, seated HS stretch; 11/20: supine heel prop, SAQ, LAQ, SLR    Consulted and Agree with Plan of Care  Patient       Patient will benefit from skilled therapeutic intervention in order to improve the following deficits and impairments:  Abnormal gait, Decreased activity tolerance, Decreased balance, Decreased endurance, Decreased mobility, Decreased range of motion, Decreased  scar mobility, Decreased strength, Difficulty walking, Hypomobility, Increased edema, Increased fascial restricitons, Increased muscle spasms, Impaired flexibility, Pain  Visit Diagnosis: Stiffness of right knee, not elsewhere classified  Localized edema  Difficulty in walking, not elsewhere classified     Problem List Patient Active Problem List   Diagnosis Date Noted  . OA (osteoarthritis) of knee 01/11/2018  . Gout 12/20/2015  . [place] 12/20/2015  . Hyperlipidemia 12/20/2015  . Sleep apnea 12/20/2015  . [place]is [place] 12/20/2015        BGeraldine SolarPT, DPT  [address] [address]Phone: [phone]  Fax:  [phone] Name: SKERT SHACKETTMRN: [id number] of Birth: 101-19-1957

## 2018-03-22 ENCOUNTER — Ambulatory Visit (HOSPITAL_COMMUNITY): Payer: BLUE CROSS/BLUE SHIELD | Admitting: Physical Therapy

## 2018-03-22 DIAGNOSIS — M25661 Stiffness of right knee, not elsewhere classified: Secondary | ICD-10-CM | POA: Diagnosis not present

## 2018-03-22 DIAGNOSIS — R262 Difficulty in walking, not elsewhere classified: Secondary | ICD-10-CM

## 2018-03-22 DIAGNOSIS — R600 Localized edema: Secondary | ICD-10-CM | POA: Diagnosis not present

## 2018-03-22 NOTE — Therapy (Signed)
[place], [place], [address] Phone: [phone]   Fax:  [phone]  Physical Therapy Treatment  Patient Details  Name: Jordan Roth MRN: [id number] Date of Birth: [birth year] Referring Provider (PT): Theresa Duty, PA-C (Surgeon: Gaynelle Arabian, MD)   Encounter Date: 03/22/2018  PT End of Session - 03/22/18 1400    Visit Number  21    Number of Visits  27    Date for PT Re-Evaluation  03/25/18   minireassess complete visit #15 on 02/25/18   Authorization Type  [employer]    Authorization Time Period  01/15/18 to 02/26/18; new: 02/25/18 TO 03/25/18    Authorization - Visit Number  21    Authorization - Number of Visits  60   PT/OT/SLP combined   PT Start Time  1304    PT Stop Time  1345    PT Time Calculation (min)  41 min    Activity Tolerance  Patient tolerated treatment well    Behavior During Therapy  [facility] for tasks assessed/performed       Past Medical History:  Diagnosis Date  . Angina pectoris ([place]) 2017   ABNORMAL STRESS TEST CATH DONE AT [facility] RESULTS OK  . Cancer ([place]) 2000   Pre-esophogeal  . COPD (chronic obstructive pulmonary disease) ([place])   . Gout   . History of kidney stones   . Hyperlipidemia   . Muscle cramps at night   . Osteoarthrosis    OA  . Raynaud's disease    fingers  . Sleep apnea    NO CPAP USE LAST 6 MONTHS DUE TO IRRITATES NOSE    Past Surgical History:  Procedure Laterality Date  . CARDIAC CATHETERIZATION  2017 [facility]  . COLONOSCOPY    . surgery for barretts esophagus  2000  . surgery for kidney stone  2009 or 2010  . TOTAL KNEE ARTHROPLASTY Right 01/11/2018   Procedure: RIGHT TOTAL KNEE ARTHROPLASTY;  Surgeon: Gaynelle Arabian, MD;  Location: [facility];  Service: Orthopedics;  Laterality: Right;    There were no vitals filed for this visit.  Subjective Assessment - 03/22/18 1359    Subjective  Pt states he is trying to stretch it as much as he can.  STates it  is still tight in the back and makes a popping/clicking noise when he bends it.  States he returns to MD tomororw.      Currently in Pain?  No/denies                       [facility] Adult PT Treatment/Exercise - 03/22/18 0001      Knee/Hip Exercises: Stretches   Active Hamstring Stretch  Right;3 reps;30 seconds    Gastroc Stretch  Right;3 reps;30 seconds    Gastroc Stretch Limitations  slant board      Knee/Hip Exercises: Supine   Quad Sets  Right;10 reps    Quad Sets Limitations  with OP from Chartered loss adjuster  AROM;15 reps    Knee Extension  PROM    Knee Extension Limitations  16 AROM    Knee Flexion  PROM    Knee Flexion Limitations  122 AROM       Knee/Hip Exercises: Prone   Prone Knee Hang Limitations  x47mns +manual to HS and gastroc      Manual Therapy   Manual Therapy  Joint mobilization;Soft tissue mobilization;Myofascial release    Manual  therapy comments  completed separate rest of treatment    Soft tissue mobilization  STM during prone knee hang, supine heel prop to bil HS and R quads and proximal scar    Myofascial Release  to hamstring and posterior structures to reduce adhesions/restrictions in prone               PT Short Term Goals - 02/25/18 1346      PT SHORT TERM GOAL #1   Title  Pt will have reduced R knee joint line edema by 3cm or > in order to improved ROM and reduce pain.    Baseline  12/12: 37.5 joint line    Time  3    Period  Weeks    Status  Partially Met      PT SHORT TERM GOAL #2   Title  Pt will have improved R knee AROM from 8-100deg in order to maximize gait.     Baseline  12/12: 12-112    Status  Partially Met      PT SHORT TERM GOAL #3   Title  Pt will be able to perform R SLS for 10 sec without UE support to maximize gait and demo improved functional strength.    Baseline  11/22: 43sec RLE    Status  Achieved      PT SHORT TERM GOAL #4   Title  Pt will be able to perform 5xSTS in 12sec or <  without UE and with proper form to demo improved balance and functional strength.     Baseline  12/12: 9sec, slightly off-shift from RLE    Status  Partially Met        PT Long Term Goals - 02/25/18 1346      PT LONG TERM GOAL #1   Title  Pt will have improved R knee AROM from 0-120deg in order to further maximize gait and improve stair ambulation.    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    Baseline  12/12: all 5/5 except R HS 4+/5    Status  Achieved      PT LONG TERM GOAL #3   Title  Pt will ambulate at least 643f without AD and gait WNL during 3MWT in order to demo improved endurance and strength to allow him to maximize his community access and return to PLOF.     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     Baseline  12/12: 30sec or > BLE    Status  Achieved            Plan - 03/22/18 1400    Clinical Impression Statement  contiued with primary focus on Rt knee extension.  Conitued with manual before and after AROM therex with contiued tightness noted.  Unable to achieve greater than 16 degrees today with extension.  Suggested to MD trial of JAS progressive  extension brace to see if this will help improve lack of ROM and improve gait.      Rehab Potential  Good    PT Frequency  3x / week    PT Duration  6 weeks    PT Treatment/Interventions  ADLs/Self Care Home Management;Aquatic Therapy;Cryotherapy;Electrical Stimulation;Moist Heat;Ultrasound;DME Instruction;Gait training;Stair training;Functional mobility  training;Therapeutic activities;Therapeutic exercise;Balance training;Neuromuscular re-education;Patient/family education;Manual techniques;Orthotic Fit/Training;Scar mobilization;Passive range of motion;Dry needling;Energy conservation;Taping    PT Next Visit Plan  continue focus on extension, great toe extension, R ankle DF.  MEasure and order JAS brace if MD approves.     PT Home Exercise Plan  eval: quad sets, heel slides, seated HS stretch; 11/20: supine heel prop, SAQ, LAQ, SLR    Consulted and Agree with Plan of Care  Patient       Patient will benefit from skilled therapeutic intervention in order to improve the following deficits and impairments:  Abnormal gait, Decreased activity tolerance, Decreased balance, Decreased endurance, Decreased mobility, Decreased range of motion, Decreased scar mobility, Decreased strength, Difficulty walking, Hypomobility, Increased edema, Increased fascial restricitons, Increased muscle spasms, Impaired flexibility, Pain  Visit Diagnosis: Stiffness of right knee, not elsewhere classified  Localized edema  Difficulty in walking, not elsewhere classified     Problem List Patient Active Problem List   Diagnosis Date Noted  . OA (osteoarthritis) of knee 01/11/2018  . Gout 12/20/2015  . COPD (chronic obstructive pulmonary disease) ([place]) 12/20/2015  . Hyperlipidemia 12/20/2015  . Sleep apnea 12/20/2015  . Angina pectoris ([place]) 12/20/2015   Teena Irani, PTA/CLT [phone]  Teena Irani 03/22/2018, 2:04 PM  [facility] [place] [address] [place], [place], [address] Phone: [phone]   Fax:  [phone]  Name: BHARGAV BARBARO MRN: [id number] Date of Birth: [birth year]

## 2018-03-23 ENCOUNTER — Telehealth (HOSPITAL_COMMUNITY): Payer: Self-pay

## 2018-03-23 NOTE — Telephone Encounter (Signed)
Pt saw MD and Md states for him to put PT on hold for a few weeks to give his pulled hamstring time to heal. He will call back to r

## 2018-03-24 ENCOUNTER — Ambulatory Visit (HOSPITAL_COMMUNITY): Payer: BLUE CROSS/BLUE SHIELD

## 2018-03-26 ENCOUNTER — Encounter (HOSPITAL_COMMUNITY): Payer: 59

## 2018-03-30 DIAGNOSIS — L918 Other hypertrophic disorders of the skin: Secondary | ICD-10-CM | POA: Diagnosis not present

## 2018-03-30 DIAGNOSIS — L821 Other seborrheic keratosis: Secondary | ICD-10-CM | POA: Diagnosis not present

## 2018-03-30 DIAGNOSIS — Z23 Encounter for immunization: Secondary | ICD-10-CM | POA: Diagnosis not present

## 2018-03-30 DIAGNOSIS — L570 Actinic keratosis: Secondary | ICD-10-CM | POA: Diagnosis not present

## 2018-06-02 ENCOUNTER — Encounter (HOSPITAL_COMMUNITY): Payer: Self-pay

## 2018-06-02 NOTE — Therapy (Signed)
[place], [place], [address] Phone: [phone]   Fax:  [phone]  Patient Details  Name: Jordan Roth MRN: [id number] Date of Birth: 1955-08-14 Referring Provider:  No ref. provider found  Encounter Date: 06/02/2018  PHYSICAL THERAPY DISCHARGE SUMMARY  Visits from Start of Care: 21  Current functional level related to goals / functional outcomes: See last note   Remaining deficits: See last note   Education / Equipment: See last note  Plan: Patient agrees to discharge.  Patient goals were partially met. Patient is being discharged due to not returning since the last visit.  ?????       Geraldine Solar PT, [facility] [address] [place], [place], [address] Phone: [phone]   Fax:  [phone]

## 2018-07-02 DIAGNOSIS — R945 Abnormal results of liver function studies: Secondary | ICD-10-CM | POA: Diagnosis not present

## 2018-07-02 DIAGNOSIS — R7301 Impaired fasting glucose: Secondary | ICD-10-CM | POA: Diagnosis not present

## 2018-07-02 DIAGNOSIS — R944 Abnormal results of kidney function studies: Secondary | ICD-10-CM | POA: Diagnosis not present

## 2018-07-02 DIAGNOSIS — E782 Mixed hyperlipidemia: Secondary | ICD-10-CM | POA: Diagnosis not present

## 2018-08-02 DIAGNOSIS — E782 Mixed hyperlipidemia: Secondary | ICD-10-CM | POA: Diagnosis not present

## 2018-08-02 DIAGNOSIS — R7301 Impaired fasting glucose: Secondary | ICD-10-CM | POA: Diagnosis not present

## 2018-08-06 DIAGNOSIS — R945 Abnormal results of liver function studies: Secondary | ICD-10-CM | POA: Diagnosis not present

## 2018-08-06 DIAGNOSIS — R7301 Impaired fasting glucose: Secondary | ICD-10-CM | POA: Diagnosis not present

## 2018-08-06 DIAGNOSIS — R944 Abnormal results of kidney function studies: Secondary | ICD-10-CM | POA: Diagnosis not present

## 2018-08-06 DIAGNOSIS — E782 Mixed hyperlipidemia: Secondary | ICD-10-CM | POA: Diagnosis not present

## 2018-08-13 DIAGNOSIS — Z471 Aftercare following joint replacement surgery: Secondary | ICD-10-CM | POA: Diagnosis not present

## 2018-08-13 DIAGNOSIS — Z96651 Presence of right artificial knee joint: Secondary | ICD-10-CM | POA: Diagnosis not present

## 2018-08-26 DIAGNOSIS — S51811A Laceration without foreign body of right forearm, initial encounter: Secondary | ICD-10-CM | POA: Diagnosis not present

## 2018-09-02 DIAGNOSIS — S51811S Laceration without foreign body of right forearm, sequela: Secondary | ICD-10-CM | POA: Diagnosis not present

## 2018-09-06 DIAGNOSIS — S51811D Laceration without foreign body of right forearm, subsequent encounter: Secondary | ICD-10-CM | POA: Diagnosis not present

## 2018-10-01 DIAGNOSIS — Z471 Aftercare following joint replacement surgery: Secondary | ICD-10-CM | POA: Diagnosis not present

## 2018-10-01 DIAGNOSIS — Z96651 Presence of right artificial knee joint: Secondary | ICD-10-CM | POA: Diagnosis not present

## 2018-10-12 DIAGNOSIS — M898X8 Other specified disorders of bone, other site: Secondary | ICD-10-CM | POA: Diagnosis not present

## 2018-10-12 DIAGNOSIS — M2682 Posterior soft tissue impingement: Secondary | ICD-10-CM | POA: Diagnosis not present

## 2018-10-12 DIAGNOSIS — D1621 Benign neoplasm of long bones of right lower limb: Secondary | ICD-10-CM | POA: Diagnosis not present

## 2018-10-12 DIAGNOSIS — Z96651 Presence of right artificial knee joint: Secondary | ICD-10-CM | POA: Diagnosis not present

## 2018-11-03 ENCOUNTER — Encounter (INDEPENDENT_AMBULATORY_CARE_PROVIDER_SITE_OTHER): Payer: Self-pay | Admitting: *Deleted

## 2018-11-03 ENCOUNTER — Telehealth (INDEPENDENT_AMBULATORY_CARE_PROVIDER_SITE_OTHER): Payer: Self-pay | Admitting: *Deleted

## 2018-11-03 ENCOUNTER — Ambulatory Visit (INDEPENDENT_AMBULATORY_CARE_PROVIDER_SITE_OTHER): Payer: BC Managed Care – PPO | Admitting: Nurse Practitioner

## 2018-11-03 ENCOUNTER — Encounter (INDEPENDENT_AMBULATORY_CARE_PROVIDER_SITE_OTHER): Payer: Self-pay | Admitting: Nurse Practitioner

## 2018-11-03 ENCOUNTER — Other Ambulatory Visit: Payer: Self-pay

## 2018-11-03 VITALS — BP 123/78 | HR 60 | Temp 98.1°F | Ht 72.0 in | Wt 189.0 lb

## 2018-11-03 DIAGNOSIS — K219 Gastro-esophageal reflux disease without esophagitis: Secondary | ICD-10-CM | POA: Insufficient documentation

## 2018-11-03 DIAGNOSIS — Z1211 Encounter for screening for malignant neoplasm of colon: Secondary | ICD-10-CM

## 2018-11-03 DIAGNOSIS — Z8719 Personal history of other diseases of the digestive system: Secondary | ICD-10-CM

## 2018-11-03 MED ORDER — PEG 3350-KCL-NA BICARB-NACL 420 G PO SOLR: 4000.0000 mL | Freq: Once | ORAL | 0 refills | Status: AC

## 2018-11-03 NOTE — Telephone Encounter (Signed)
Patient needs trilyte TCS/EGD sch'd 9/25

## 2018-11-03 NOTE — Patient Instructions (Signed)
1. Schedule an EGD and colonoscopy   2. Do not take your aspirin for 2 days before your procedures  3. Further follow up to be determined after EGD and colonoscopy completed

## 2018-11-03 NOTE — Progress Notes (Signed)
Subjective:    Patient ID: Jordan Roth, male    DOB: 06/12/1955, 63 y.o.   MRN: [id number]  HPI: Jordan Roth is a 63 year old male with a past medical history of angina, obstructive sleep apnea on CPAP, questionable COPD, kidney stones, hyperlipidemia, GERD, reflux surgery ? Nissen Fundoplication and Barrett's esophagus. He underwent right knee total replacement surgery 01/12/2018 and he had a right knee surgery to" shave part of the bone 3 weeks ago". He presents today to schedule an EGD and colonoscopy. His last EGD and colonoscopy were done by Dr. Laural Golden 09/28/2007. The EGD showed a probable short segment of Barrett's esophagus, intact fundal wrap and erosive gastritis. Biopsies did not show evidence of Barrett's mucosa. The colonoscopy identified a small tubular adenomatous polyp in the sigmoid colon. He has infrequent heartburn.  He describes episodes of bread briefly getting stuck in his throat, feels like he is choking, he coughs the bread out without distress occurs once every 3 weeks or less.  He is not currently taking any PPI.  Denies having any upper or lower abdominal pain.  He is passing a normal normal formed brown stool daily.  No rectal bleeding or melena. His history of angina is vague.  He reported having a cardiac cath in 2017 that showed a small blockage about 30% to a nonsignificant vessel.  He no longer followed by cardiologist.  He takes aspirin 81 mg once daily.  No other NSAID use. No chest pain or SOB.  Labs 08/02/2018: WBC 4.8.  Hemoglobin 14.8.  Hematocrit 44.3.  Platelet 283.  Glucose 106.  BUN 14.  Creatinine 1.35.  GFR 55.  Alk phos 96.  AST 26.  ALT 25.  Past Medical History:  Diagnosis Date  . Angina pectoris ([place]) 2017   ABNORMAL STRESS TEST CATH DONE AT [facility] RESULTS OK  . Cancer ([place]) 2000   Pre-esophogeal  . COPD (chronic obstructive pulmonary disease) ([place])   . Gout   . History of kidney stones   . Hyperlipidemia   . Muscle cramps at night   .  Osteoarthrosis    OA  . Raynaud's disease    fingers  . Sleep apnea    NO CPAP USE LAST 6 MONTHS DUE TO IRRITATES NOSE   Past Surgical History:  Procedure Laterality Date  . CARDIAC CATHETERIZATION  2017 [facility]  . COLONOSCOPY    . surgery for barretts esophagus  2000  . surgery for kidney stone  2009 or 2010  . TOTAL KNEE ARTHROPLASTY Right 01/11/2018   Procedure: RIGHT TOTAL KNEE ARTHROPLASTY;  Surgeon: Gaynelle Arabian, MD;  Location: [facility];  Service: Orthopedics;  Laterality: Right;    Current Outpatient Medications on File Prior to Visit  Medication Sig Dispense Refill  . aspirin EC 81 MG tablet Take 81 mg by mouth daily.    Marland Kitchen atorvastatin (LIPITOR) 20 MG tablet Take 20 mg by mouth at bedtime.     . Coenzyme Q10 (CO Q-10 PO) Take 1 capsule by mouth at bedtime. 200 MG    . Omega-3 Fatty Acids (FISH OIL) 1000 MG CAPS Take 1,000 mg by mouth at bedtime.      No current facility-administered medications on file prior to visit.    Allergies  Allergen Reactions  . Tetracyclines & Related Rash     Social History   Socioeconomic History  . Marital status: Married    Spouse name: Not on file  . Number of children: Not on file  .  Years of education: Not on file  . Highest education level: Not on file  Occupational History  . Not on file  Social Needs  . Financial resource strain: Not on file  . Food insecurity    Worry: Not on file    Inability: Not on file  . Transportation needs    Medical: Not on file    Non-medical: Not on file  Tobacco Use  . Smoking status: Former Smoker    Years: 10.00    Types: Cigarettes  . Smokeless tobacco: Never Used  . Tobacco comment: quit 1981  Substance and Sexual Activity  . Alcohol use: Yes    Comment: amount varis daily from none to six beer  . Drug use: No  . Sexual activity: Yes    Partners: Female  Lifestyle  . Physical activity    Days per week: Not on file    Minutes per session: Not on file  . Stress: Not on file   Relationships  . Social connections    Talks on phone: Not on file    Gets together: Not on file    Attends religious service: Not on file    Active member of club or organization: Not on file    Attends meetings of clubs or organizations: Not on file    Relationship status: Not on file  . Intimate partner violence    Fear of current or ex partner: Not on file    Emotionally abused: Not on file    Physically abused: Not on file    Forced sexual activity: Not on file  Other Topics Concern  . Not on file  Social History Narrative  . Not on file   Review of Systems  See HPI, all other systems reviewed and are negative      Objective:   Physical Exam BP 123/78   Pulse 60   Temp 98.1 F (36.7 C)   Ht 6' (1.829 m)   Wt 189 lb (85.7 kg)   BMI 25.63 kg/m  General: 63-year-old male in no acute distress Eyes: Sclera nonicteric, conjunctiva pink Mouth: Dentition intact, no ulcers: Neck: Supple, no thyromegaly or lymphadenopathy Heart: Regular rate and rhythm, no murmurs Lungs: Breath sounds clear throughout Abdomen: Soft, nontender, no masses or organomegaly Extremities: No edema Neuro: Alert and oriented x4, no focal deficits    Assessment & Plan:  1.  63-year-old male with a prior history of GERD, Barrett's esophagus, reflux surgery PPI therapy -EGD  2.  Prior history of colon polyps, colonoscopy in 2009 identified a small tubular adenomatous polyp -Schedule a colonoscopy  3.  History of COPD, the patient does not require any inhalers, he no longer thinks he has COPD  4.  Sleep apnea on CPAP Follow-up to be determined after EGD and colonoscopy plated 

## 2018-11-09 DIAGNOSIS — M7989 Other specified soft tissue disorders: Secondary | ICD-10-CM | POA: Insufficient documentation

## 2018-11-23 DIAGNOSIS — R21 Rash and other nonspecific skin eruption: Secondary | ICD-10-CM | POA: Diagnosis not present

## 2018-11-23 DIAGNOSIS — T7840XA Allergy, unspecified, initial encounter: Secondary | ICD-10-CM | POA: Diagnosis not present

## 2018-11-23 DIAGNOSIS — I959 Hypotension, unspecified: Secondary | ICD-10-CM | POA: Diagnosis not present

## 2018-11-23 DIAGNOSIS — R069 Unspecified abnormalities of breathing: Secondary | ICD-10-CM | POA: Diagnosis not present

## 2018-11-29 DIAGNOSIS — T782XXA Anaphylactic shock, unspecified, initial encounter: Secondary | ICD-10-CM | POA: Diagnosis not present

## 2018-12-06 ENCOUNTER — Encounter (HOSPITAL_COMMUNITY): Payer: Self-pay

## 2018-12-07 ENCOUNTER — Other Ambulatory Visit (HOSPITAL_COMMUNITY)
Admission: RE | Admit: 2018-12-07 | Discharge: 2018-12-07 | Disposition: A | Discharge status: Home / Self Care | Payer: BC Managed Care – PPO | Source: Ambulatory Visit | Attending: Internal Medicine | Admitting: Internal Medicine

## 2018-12-07 ENCOUNTER — Other Ambulatory Visit: Payer: Self-pay

## 2018-12-07 ENCOUNTER — Encounter (HOSPITAL_COMMUNITY)
Admission: RE | Admit: 2018-12-07 | Discharge: 2018-12-07 | Disposition: A | Discharge status: Home / Self Care | Payer: BC Managed Care – PPO | Source: Ambulatory Visit | Attending: Internal Medicine | Admitting: Internal Medicine

## 2018-12-07 DIAGNOSIS — Z20828 Contact with and (suspected) exposure to other viral communicable diseases: Secondary | ICD-10-CM | POA: Diagnosis not present

## 2018-12-07 DIAGNOSIS — Z01812 Encounter for preprocedural laboratory examination: Secondary | ICD-10-CM | POA: Insufficient documentation

## 2018-12-07 DIAGNOSIS — K5790 Diverticulosis of intestine, part unspecified, without perforation or abscess without bleeding: Secondary | ICD-10-CM | POA: Diagnosis not present

## 2018-12-07 DIAGNOSIS — K449 Diaphragmatic hernia without obstruction or gangrene: Secondary | ICD-10-CM | POA: Diagnosis not present

## 2018-12-07 DIAGNOSIS — K219 Gastro-esophageal reflux disease without esophagitis: Secondary | ICD-10-CM | POA: Diagnosis not present

## 2018-12-07 DIAGNOSIS — K635 Polyp of colon: Secondary | ICD-10-CM | POA: Diagnosis not present

## 2018-12-07 DIAGNOSIS — K2980 Duodenitis without bleeding: Secondary | ICD-10-CM | POA: Insufficient documentation

## 2018-12-07 LAB — SARS CORONAVIRUS 2 (TAT 6-24 HRS): SARS Coronavirus 2: NEGATIVE

## 2018-12-10 ENCOUNTER — Encounter (HOSPITAL_COMMUNITY)
Admission: RE | Disposition: A | Discharge status: Home / Self Care | Payer: Self-pay | Source: Home / Self Care | Attending: Internal Medicine

## 2018-12-10 ENCOUNTER — Ambulatory Visit (HOSPITAL_COMMUNITY): Payer: BC Managed Care – PPO | Admitting: Anesthesiology

## 2018-12-10 ENCOUNTER — Encounter (HOSPITAL_COMMUNITY): Payer: Self-pay | Admitting: *Deleted

## 2018-12-10 ENCOUNTER — Ambulatory Visit (HOSPITAL_COMMUNITY)
Admission: RE | Admit: 2018-12-10 | Discharge: 2018-12-10 | Disposition: A | Discharge status: Home / Self Care | Payer: BC Managed Care – PPO | Attending: Internal Medicine | Admitting: Internal Medicine

## 2018-12-10 DIAGNOSIS — I7300 Raynaud's syndrome without gangrene: Secondary | ICD-10-CM | POA: Insufficient documentation

## 2018-12-10 DIAGNOSIS — G4730 Sleep apnea, unspecified: Secondary | ICD-10-CM | POA: Insufficient documentation

## 2018-12-10 DIAGNOSIS — K219 Gastro-esophageal reflux disease without esophagitis: Secondary | ICD-10-CM

## 2018-12-10 DIAGNOSIS — E785 Hyperlipidemia, unspecified: Secondary | ICD-10-CM | POA: Diagnosis not present

## 2018-12-10 DIAGNOSIS — J449 Chronic obstructive pulmonary disease, unspecified: Secondary | ICD-10-CM | POA: Diagnosis not present

## 2018-12-10 DIAGNOSIS — Z7982 Long term (current) use of aspirin: Secondary | ICD-10-CM | POA: Diagnosis not present

## 2018-12-10 DIAGNOSIS — K228 Other specified diseases of esophagus: Secondary | ICD-10-CM | POA: Diagnosis not present

## 2018-12-10 DIAGNOSIS — K449 Diaphragmatic hernia without obstruction or gangrene: Secondary | ICD-10-CM | POA: Insufficient documentation

## 2018-12-10 DIAGNOSIS — D126 Benign neoplasm of colon, unspecified: Secondary | ICD-10-CM | POA: Diagnosis not present

## 2018-12-10 DIAGNOSIS — Z1211 Encounter for screening for malignant neoplasm of colon: Secondary | ICD-10-CM | POA: Insufficient documentation

## 2018-12-10 DIAGNOSIS — M1990 Unspecified osteoarthritis, unspecified site: Secondary | ICD-10-CM | POA: Insufficient documentation

## 2018-12-10 DIAGNOSIS — K2270 Barrett's esophagus without dysplasia: Secondary | ICD-10-CM | POA: Insufficient documentation

## 2018-12-10 DIAGNOSIS — Z87442 Personal history of urinary calculi: Secondary | ICD-10-CM | POA: Insufficient documentation

## 2018-12-10 DIAGNOSIS — D123 Benign neoplasm of transverse colon: Secondary | ICD-10-CM | POA: Insufficient documentation

## 2018-12-10 DIAGNOSIS — Z79899 Other long term (current) drug therapy: Secondary | ICD-10-CM | POA: Insufficient documentation

## 2018-12-10 DIAGNOSIS — Z87891 Personal history of nicotine dependence: Secondary | ICD-10-CM | POA: Insufficient documentation

## 2018-12-10 DIAGNOSIS — K2990 Gastroduodenitis, unspecified, without bleeding: Secondary | ICD-10-CM

## 2018-12-10 DIAGNOSIS — K5730 Diverticulosis of large intestine without perforation or abscess without bleeding: Secondary | ICD-10-CM | POA: Diagnosis not present

## 2018-12-10 DIAGNOSIS — Z9889 Other specified postprocedural states: Secondary | ICD-10-CM | POA: Diagnosis not present

## 2018-12-10 HISTORY — PX: BIOPSY: SHX5522

## 2018-12-10 HISTORY — PX: ESOPHAGOGASTRODUODENOSCOPY (EGD) WITH PROPOFOL: SHX5813

## 2018-12-10 HISTORY — PX: COLONOSCOPY WITH PROPOFOL: SHX5780

## 2018-12-10 HISTORY — PX: POLYPECTOMY: SHX5525

## 2018-12-10 SURGERY — ESOPHAGOGASTRODUODENOSCOPY (EGD) WITH PROPOFOL
Anesthesia: General

## 2018-12-10 MED ORDER — HYDROMORPHONE HCL 1 MG/ML IJ SOLN: 0.2500 mg | INTRAMUSCULAR | Status: DC | PRN

## 2018-12-10 MED ORDER — CHLORHEXIDINE GLUCONATE CLOTH 2 % EX PADS: 6.0000 | MEDICATED_PAD | Freq: Once | CUTANEOUS | Status: DC

## 2018-12-10 MED ORDER — LACTATED RINGERS IV SOLN: INTRAVENOUS | Status: DC

## 2018-12-10 MED ORDER — LACTATED RINGERS IV SOLN
INTRAVENOUS | Status: DC | PRN
  Administered 2018-12-10: 12:00:00 via INTRAVENOUS

## 2018-12-10 MED ORDER — LIDOCAINE HCL (CARDIAC) PF 100 MG/5ML IV SOSY
PREFILLED_SYRINGE | INTRAVENOUS | Status: DC | PRN
  Administered 2018-12-10: 40 mg via INTRAVENOUS

## 2018-12-10 MED ORDER — KETAMINE HCL 10 MG/ML IJ SOLN
INTRAMUSCULAR | Status: DC | PRN
  Administered 2018-12-10: 20 mg via INTRAVENOUS

## 2018-12-10 MED ORDER — PROPOFOL 10 MG/ML IV BOLUS
INTRAVENOUS | Status: AC
  Filled 2018-12-10: qty 40

## 2018-12-10 MED ORDER — KETAMINE HCL 50 MG/5ML IJ SOSY
PREFILLED_SYRINGE | INTRAMUSCULAR | Status: AC
  Filled 2018-12-10: qty 5

## 2018-12-10 MED ORDER — MIDAZOLAM HCL 2 MG/2ML IJ SOLN: 0.5000 mg | Freq: Once | INTRAMUSCULAR | Status: DC | PRN

## 2018-12-10 MED ORDER — GLYCOPYRROLATE 0.2 MG/ML IJ SOLN
INTRAMUSCULAR | Status: DC | PRN
  Administered 2018-12-10 (×2): 0.2 mg via INTRAVENOUS

## 2018-12-10 MED ORDER — HYDROCODONE-ACETAMINOPHEN 7.5-325 MG PO TABS: 1.0000 | ORAL_TABLET | Freq: Once | ORAL | Status: DC | PRN

## 2018-12-10 MED ORDER — PROPOFOL 10 MG/ML IV BOLUS
INTRAVENOUS | Status: DC | PRN
  Administered 2018-12-10 (×2): 20 mg via INTRAVENOUS

## 2018-12-10 MED ORDER — PROPOFOL 500 MG/50ML IV EMUL
INTRAVENOUS | Status: DC | PRN
  Administered 2018-12-10: 13:00:00 via INTRAVENOUS
  Administered 2018-12-10: 150 ug/kg/min via INTRAVENOUS

## 2018-12-10 MED ORDER — PROMETHAZINE HCL 25 MG/ML IJ SOLN: 6.2500 mg | INTRAMUSCULAR | Status: DC | PRN

## 2018-12-10 NOTE — Op Note (Signed)
[facility] Patient Name: Jordan Roth Procedure Date: 12/10/2018 11:27 AM MRN: [id number] Date of Birth: April 24, 1955 Attending MD: Hildred Laser , MD CSN: [id number] Age: 63 Admit Type: Outpatient Procedure:                Colonoscopy Indications:              Screening for colorectal malignant neoplasm Providers:                Hildred Laser, MD, Otis Peak B. Sharon Seller, RN, Aram Candela Referring MD:             Delphina Cahill, MD Medicines:                Propofol per Anesthesia Complications:            No immediate complications. Estimated Blood Loss:     Estimated blood loss: none. Procedure:                Pre-Anesthesia Assessment:                           - Prior to the procedure, a History and Physical                            was performed, and patient medications and                            allergies were reviewed. The patient's tolerance of                            previous anesthesia was also reviewed. The risks                            and benefits of the procedure and the sedation                            options and risks were discussed with the patient.                            All questions were answered, and informed consent                            was obtained. Prior Anticoagulants: The patient has                            taken no previous anticoagulant or antiplatelet                            agents except for aspirin. ASA Grade Assessment: II                            - A patient with mild systemic disease. After  reviewing the risks and benefits, the patient was                            deemed in satisfactory condition to undergo the                            procedure.                           After obtaining informed consent, the colonoscope                            was passed under direct vision. Throughout the                            procedure, the patient's blood pressure,  pulse, and                            oxygen saturations were monitored continuously. The                            [id number] scope was introduced through                            the anus and advanced to the the cecum, identified                            by appendiceal orifice and ileocecal valve. The                            colonoscopy was performed without difficulty. The                            patient tolerated the procedure well. The quality                            of the bowel preparation was good. The ileocecal                            valve, appendiceal orifice, and rectum were                            photographed. Scope In: [id number] PM Scope Out: 1:13:24 PM Scope Withdrawal Time: 0 hours 21 minutes 48 seconds  Total Procedure Duration: 0 hours 25 minutes 11 seconds  Findings:      The perianal and digital rectal examinations were normal.      A 10 mm polyp was found in the transverse colon. The polyp was       semi-pedunculated. The polyp was removed with a hot snare. Resection and       retrieval were complete. The pathology specimen was placed into Bottle       Number 1.      A 7 mm polyp was found in the splenic flexure. The polyp was removed       with a hot snare.  Resection and retrieval were complete. The pathology       specimen was placed into Bottle Number 1.      A few diverticula were found in the sigmoid colon and hepatic flexure.      The retroflexed view of the distal rectum and anal verge was normal and       showed no anal or rectal abnormalities. Impression:               - One 10 mm polyp in the transverse colon, removed                            with a hot snare. Resected and retrieved.                           - One 7 mm polyp at the splenic flexure, removed                            with a hot snare. Resected and retrieved.                           - Diverticulosis in the sigmoid colon and at the                             hepatic flexure. Moderate Sedation:      Per Anesthesia Care Recommendation:           - Patient has a contact number available for                            emergencies. The signs and symptoms of potential                            delayed complications were discussed with the                            patient. Return to normal activities tomorrow.                            Written discharge instructions were provided to the                            patient.                           - High fiber diet today.                           - Continue present medications.                           - No aspirin, ibuprofen, naproxen, or other                            non-steroidal anti-inflammatory drugs for 7 days.                           -  Await pathology results.                           - Repeat colonoscopy is recommended. The                            colonoscopy date will be determined after pathology                            results from today's exam become available for                            review. Procedure Code(s):        --- Professional ---                           [phone], Colonoscopy, flexible; with removal of                            tumor(s), polyp(s), or other lesion(s) by snare                            technique Diagnosis Code(s):        --- Professional ---                           Z12.11, Encounter for screening for malignant                            neoplasm of colon                           K63.5, Polyp of colon                           K57.30, Diverticulosis of large intestine without                            perforation or abscess without bleeding CPT copyright 2019 American Medical Association. All rights reserved. The codes documented in this report are preliminary and upon coder review may  be revised to meet current compliance requirements. Hildred Laser, MD Hildred Laser, MD 12/10/2018 1:29:35 PM This report has been signed  electronically. Number of Addenda: 0

## 2018-12-10 NOTE — Anesthesia Preprocedure Evaluation (Signed)
Anesthesia Evaluation  Patient identified by MRN, date of birth, ID band Patient awake    Reviewed: Allergy & Precautions, NPO status , Patient's Chart, lab work & pertinent test results  Airway Mallampati: I  TM Distance: >3 FB Neck ROM: Full    Dental no notable dental hx. (+) Teeth Intact   Pulmonary sleep apnea and Continuous Positive Airway Pressure Ventilation , COPD, former smoker,  Ex smoker -states off all COPD meds for a few years   Quit smoking ~30 years ago   Reports OSA and CPAP use    Pulmonary exam normal breath sounds clear to auscultation       Cardiovascular Exercise Tolerance: Good + angina with exertion Normal cardiovascular examI Rhythm:Regular Rate:Normal  States had - w/u Melony Overly at [facility]  Denies recent CP/MI or DOE   Neuro/Psych negative neurological ROS  negative psych ROS   GI/Hepatic Neg liver ROS, GERD  Controlled,States off GERD meds after Nissen ~2000 Denies Sx -    Endo/Other  negative endocrine ROS  Renal/GU negative Renal ROS  negative genitourinary   Musculoskeletal  (+) Arthritis , Osteoarthritis,    Abdominal   Peds negative pediatric ROS (+)  Hematology negative hematology ROS (+)   Anesthesia Other Findings   Reproductive/Obstetrics negative OB ROS                             Anesthesia Physical Anesthesia Plan  ASA: II  Anesthesia Plan: General   Post-op Pain Management:    Induction: Intravenous  PONV Risk Score and Plan: 2 and Propofol infusion, TIVA and Treatment may vary due to age or medical condition  Airway Management Planned: Nasal Cannula and Simple Face Mask  Additional Equipment:   Intra-op Plan:   Post-operative Plan:   Informed Consent: I have reviewed the patients History and Physical, chart, labs and discussed the procedure including the risks, benefits and alternatives for the proposed anesthesia with the patient or  authorized representative who has indicated his/her understanding and acceptance.     Dental advisory given  Plan Discussed with: CRNA  Anesthesia Plan Comments: (Plan Full PPE use  Plan GA with GETA as needed d/w pt -WTP with same after Q&A)        Anesthesia Quick Evaluation

## 2018-12-10 NOTE — Transfer of Care (Signed)
Immediate Anesthesia Transfer of Care Note  Patient: Jordan Roth  Procedure(s) Performed: ESOPHAGOGASTRODUODENOSCOPY (EGD) WITH PROPOFOL (N/A ) COLONOSCOPY WITH PROPOFOL (N/A ) BIOPSY POLYPECTOMY  Patient Location: PACU  Anesthesia Type:General  Level of Consciousness: drowsy and patient cooperative  Airway & Oxygen Therapy: Patient Spontanous Breathing  Post-op Assessment: Report given to RN and Post -op Vital signs reviewed and stable  Post vital signs: Reviewed and stable  Last Vitals:  Vitals Value Taken Time  BP 99/45 12/10/18 1320  Temp    Pulse 53 12/10/18 1321  Resp 14 12/10/18 1323  SpO2 82 % 12/10/18 1321  Vitals shown include unvalidated device data.  Last Pain:  Vitals:   12/10/18 1230  TempSrc:   PainSc: 0-No pain      Patients Stated Pain Goal: 8 ([phone] [id number])  Complications: No apparent anesthesia complications

## 2018-12-10 NOTE — H&P (Addendum)
Jordan Roth is an 63 y.o. male.   Chief Complaint: Patient is here for EGD and colonoscopy. HPI: Patient is 62 year old Caucasian male who has a history of short segment Barrett's esophagus who underwent antireflux surgery at [facility] 12 years ago and has been doing fine without any medications.  He is here for surveillance EGD.  He denies dysphagia nausea or vomiting.  He is also undergoing colonoscopy for screening purposes.  Last exam was in 2009.  His father has had polyps but no family history of CRC.  He does not take aspirin or other OTC NSAIDs.  Past Medical History:  Diagnosis Date  . Angina pectoris ([place]) 2017   ABNORMAL STRESS TEST CATH DONE AT [facility] RESULTS OK  . Cancer ([place]) 2000   Pre-esophogeal  . COPD (chronic obstructive pulmonary disease) ([place])   . Gout   . History of kidney stones   . Hyperlipidemia   . Muscle cramps at night   . Osteoarthrosis    OA  . Raynaud's disease    fingers  . Sleep apnea    NO CPAP USE LAST 6 MONTHS DUE TO IRRITATES NOSE    Past Surgical History:  Procedure Laterality Date  . CARDIAC CATHETERIZATION  2017 [facility]  . COLONOSCOPY    . surgery for barretts esophagus  2000  . surgery for kidney stone  2009 or 2010  . TOTAL KNEE ARTHROPLASTY Right 01/11/2018   Procedure: RIGHT TOTAL KNEE ARTHROPLASTY;  Surgeon: Gaynelle Arabian, MD;  Location: [facility];  Service: Orthopedics;  Laterality: Right;    Family History  Family history unknown: Yes   Social History:  reports that he has quit smoking. His smoking use included cigarettes. He quit after 10.00 years of use. He has never used smokeless tobacco. He reports current alcohol use. He reports that he does not use drugs.  Allergies:  Allergies  Allergen Reactions  . Cephalexin Hives  . Tetracyclines & Related Rash    Medications Prior to Admission  Medication Sig Dispense Refill  . aspirin EC 81 MG tablet Take 81 mg by mouth daily.    Marland Kitchen atorvastatin (LIPITOR) 20 MG tablet Take 20 mg by  mouth at bedtime.     . Coenzyme Q10 (CO Q-10) 200 MG CAPS Take 200 mg by mouth at bedtime.     . Omega-3 Fatty Acids (FISH OIL) 1000 MG CAPS Take 1,000 mg by mouth at bedtime.       No results found for this or any previous visit (from the past 48 hour(s)). No results found.  ROS  Blood pressure 118/65, temperature 98.7 F (37.1 C), temperature source Oral, resp. rate 18, height 5\' 11"  (1.803 m), weight 81.6 kg, SpO2 99 %. Physical Exam  Constitutional: He appears well-developed and well-nourished.  HENT:  Mouth/Throat: Oropharynx is clear and moist.  Eyes: Conjunctivae are normal. No scleral icterus.  Neck: No thyromegaly present.  Cardiovascular: Normal rate, regular rhythm and normal heart sounds.  No murmur heard. Respiratory: Effort normal and breath sounds normal.  GI: Soft. He exhibits no distension and no mass. There is no abdominal tenderness.  Musculoskeletal:        Jordan: No edema.  Neurological: He is alert.  Skin: Skin is warm and dry.     Assessment/Plan History of Barrett's esophagus. Surveillance EGD and average risk screening colonoscopy.  Hildred Laser, MD 12/10/2018, 12:25 PM

## 2018-12-10 NOTE — Anesthesia Procedure Notes (Signed)
Procedure Name: General with mask airway Performed by: Adams, Amy A, CRNA Pre-anesthesia Checklist: Timeout performed, Patient being monitored, Suction available, Emergency Drugs available and Patient identified Oxygen Delivery Method: Non-rebreather mask       

## 2018-12-10 NOTE — Op Note (Signed)
[facility] Patient Name: Jordan Roth Procedure Date: 12/10/2018 11:32 AM MRN: [id number] Date of Birth: 18-Nov-1955 Attending MD: Hildred Laser , MD CSN: [id number] Age: 63 Admit Type: Outpatient Procedure:                Upper GI endoscopy Indications:              Follow-up of Barrett's esophagus Providers:                Hildred Laser, MD, Gwenlyn Fudge RN, RN, Aram Candela Referring MD:             Delphina Cahill, MD Medicines:                Propofol per Anesthesia Complications:            No immediate complications. Estimated Blood Loss:     Estimated blood loss was minimal. Procedure:                Pre-Anesthesia Assessment:                           - Prior to the procedure, a History and Physical                            was performed, and patient medications and                            allergies were reviewed. The patient's tolerance of                            previous anesthesia was also reviewed. The risks                            and benefits of the procedure and the sedation                            options and risks were discussed with the patient.                            All questions were answered, and informed consent                            was obtained. Prior Anticoagulants: The patient has                            taken no previous anticoagulant or antiplatelet                            agents except for aspirin. ASA Grade Assessment: II                            - A patient with mild systemic disease. After                            reviewing the risks and benefits, the patient was  deemed in satisfactory condition to undergo the                            procedure.                           After obtaining informed consent, the endoscope was                            passed under direct vision. Throughout the                            procedure, the patient's blood pressure, pulse, and       oxygen saturations were monitored continuously. The                            [id number] ID:[id number]) scope was introduced through the                            mouth, and advanced to the second part of duodenum.                            The upper GI endoscopy was accomplished without                            difficulty. The patient tolerated the procedure                            well. Scope In: 12:33:18 PM Scope Out: 12:44:27 PM Total Procedure Duration: 0 hours 11 minutes 9 seconds  Findings:      The proximal esophagus, mid esophagus and distal esophagus were normal.      Islands of salmon-colored mucosa were present from 39 to 40 cm. This was       biopsied with a cold forceps for histology.      The Z-line was irregular and was found 40 cm from the incisors.      A 3 cm hiatal hernia was present.      Evidence of a Nissen fundoplication was found in the gastric fundus. The       wrap appeared intact.      A few erosions were found in the gastric antrum.      The exam of the stomach was otherwise normal.      Patchy mildly erythematous mucosa without active bleeding and with no       stigmata of bleeding was found in the duodenal bulb.      The second portion of the duodenum was normal. Impression:               - Normal proximal esophagus, mid esophagus and                            distal esophagus.                           - Salmon-colored mucosa suspicious for  short-segment Barrett's esophagus. Biopsied.                           - Z-line irregular, 40 cm from the incisors.                           - 3 cm hiatal hernia.                           - A Nissen fundoplication was found. The wrap                            appears intact.                           - Normal duodenal bulb and second portion of the                            duodenum. Moderate Sedation:      Per Anesthesia Care Recommendation:           - Patient has a contact  number available for                            emergencies. The signs and symptoms of potential                            delayed complications were discussed with the                            patient. Return to normal activities tomorrow.                            Written discharge instructions were provided to the                            patient.                           - Continue present medications.                           - No aspirin, ibuprofen, naproxen, or other                            non-steroidal anti-inflammatory drugs for 1 day.                           - Await pathology results.                           - H.Pylori serology.                           - See the other procedure note for documentation of  additional recommendations. Procedure Code(s):        --- Professional ---                           [phone], Esophagogastroduodenoscopy, flexible,                            transoral; with biopsy, single or multiple Diagnosis Code(s):        --- Professional ---                           K22.70, Barrett's esophagus without dysplasia                           K22.8, Other specified diseases of esophagus                           K44.9, Diaphragmatic hernia without obstruction or                            gangrene                           Z98.890, Other specified postprocedural states CPT copyright 2019 American Medical Association. All rights reserved. The codes documented in this report are preliminary and upon coder review may  be revised to meet current compliance requirements. Hildred Laser, MD Hildred Laser, MD 12/10/2018 1:24:46 PM This report has been signed electronically. Number of Addenda: 0

## 2018-12-10 NOTE — Discharge Instructions (Signed)
No aspirin or NSAIDs for 1 week. Resume other medications as before. High-fiber diet. No driving for 24 hours. Physician will call with biopsy results.   Colonoscopy, Adult, Care After This sheet gives you information about how to care for yourself after your procedure. Your doctor may also give you more specific instructions. If you have problems or questions, call your doctor. What can I expect after the procedure? After the procedure, it is common to have:  A small amount of blood in your poop for 24 hours.  Some gas.  Mild cramping or bloating in your belly. Follow these instructions at home: General instructions  For the first 24 hours after the procedure: ? Do not drive or use machinery. ? Do not sign important documents. ? Do not drink alcohol. ? Do your daily activities more slowly than normal. ? Eat foods that are soft and easy to digest.  Take over-the-counter or prescription medicines only as told by your doctor. To help cramping and bloating:   Try walking around.  Put heat on your belly (abdomen) as told by your doctor. Use a heat source that your doctor recommends, such as a moist heat pack or a heating pad. ? Put a towel between your skin and the heat source. ? Leave the heat on for 20-30 minutes. ? Remove the heat if your skin turns bright red. This is especially important if you cannot feel pain, heat, or cold. You can get burned. Eating and drinking   Drink enough fluid to keep your pee (urine) clear or pale yellow.  Return to your normal diet as told by your doctor. Avoid heavy or fried foods that are hard to digest.  Avoid drinking alcohol for as long as told by your doctor. Contact a doctor if:  You have blood in your poop (stool) 2-3 days after the procedure. Get help right away if:  You have more than a small amount of blood in your poop.  You see large clumps of tissue (blood clots) in your poop.  Your belly is swollen.  You feel sick to  your stomach (nauseous).  You throw up (vomit).  You have a fever.  You have belly pain that gets worse, and medicine does not help your pain. Summary  After the procedure, it is common to have a small amount of blood in your poop. You may also have mild cramping and bloating in your belly.  For the first 24 hours after the procedure, do not drive or use machinery, do not sign important documents, and do not drink alcohol.  Get help right away if you have a lot of blood in your poop, feel sick to your stomach, have a fever, or have more belly pain. This information is not intended to replace advice given to you by your health care provider. Make sure you discuss any questions you have with your health care provider. Document Released: 04/05/2010 Document Revised: 01/01/2017 Document Reviewed: 11/26/2015 Elsevier Patient Education  2020 [employer].   Upper Endoscopy, Adult, Care After This sheet gives you information about how to care for yourself after your procedure. Your health care provider may also give you more specific instructions. If you have problems or questions, contact your health care provider. What can I expect after the procedure? After the procedure, it is common to have:  A sore throat.  Mild stomach pain or discomfort.  Bloating.  Nausea. Follow these instructions at home:   Follow instructions from your health care provider  about what to eat or drink after your procedure.  Return to your normal activities as told by your health care provider. Ask your health care provider what activities are safe for you.  Take over-the-counter and prescription medicines only as told by your health care provider.  Do not drive for 24 hours if you were given a sedative during your procedure.  Keep all follow-up visits as told by your health care provider. This is important. Contact a health care provider if you have:  A sore throat that lasts longer than one  day.  Trouble swallowing. Get help right away if:  You vomit blood or your vomit looks like coffee grounds.  You have: ? A fever. ? Bloody, black, or tarry stools. ? A severe sore throat or you cannot swallow. ? Difficulty breathing. ? Severe pain in your chest or abdomen. Summary  After the procedure, it is common to have a sore throat, mild stomach discomfort, bloating, and nausea.  Do not drive for 24 hours if you were given a sedative during the procedure.  Follow instructions from your health care provider about what to eat or drink after your procedure.  Return to your normal activities as told by your health care provider. This information is not intended to replace advice given to you by your health care provider. Make sure you discuss any questions you have with your health care provider. Document Released: 09/02/2011 Document Revised: 08/25/2017 Document Reviewed: 08/03/2017 Elsevier Patient Education  2020 [employer].

## 2018-12-10 NOTE — Anesthesia Postprocedure Evaluation (Signed)
Anesthesia Post Note  Patient: Jordan Roth  Procedure(s) Performed: ESOPHAGOGASTRODUODENOSCOPY (EGD) WITH PROPOFOL (N/A ) COLONOSCOPY WITH PROPOFOL (N/A ) BIOPSY POLYPECTOMY  Anesthesia Type: General Level of consciousness: awake and alert, oriented and patient cooperative Pain management: pain level controlled Vital Signs Assessment: post-procedure vital signs reviewed and stable Respiratory status: spontaneous breathing Cardiovascular status: stable Postop Assessment: no apparent nausea or vomiting Anesthetic complications: no     Last Vitals:  Vitals:   12/10/18 1118 12/10/18 1320  BP: 118/65 (!) 99/45  Pulse:  (!) 53  Resp: 18   Temp: 37.1 C (P) 36.6 C  SpO2: 99%     Last Pain:  Vitals:   12/10/18 1230  TempSrc:   PainSc: 0-No pain                 [name] A

## 2018-12-13 LAB — H. PYLORI ANTIBODY, IGG: H Pylori IgG: 0.2 Index Value (ref 0.00–0.79)

## 2018-12-14 DIAGNOSIS — Z96651 Presence of right artificial knee joint: Secondary | ICD-10-CM | POA: Diagnosis not present

## 2018-12-14 DIAGNOSIS — Z471 Aftercare following joint replacement surgery: Secondary | ICD-10-CM | POA: Diagnosis not present

## 2018-12-14 LAB — SURGICAL PATHOLOGY

## 2018-12-15 ENCOUNTER — Encounter (HOSPITAL_COMMUNITY): Payer: Self-pay | Admitting: Internal Medicine

## 2018-12-20 DIAGNOSIS — L309 Dermatitis, unspecified: Secondary | ICD-10-CM | POA: Diagnosis not present

## 2018-12-20 DIAGNOSIS — Z23 Encounter for immunization: Secondary | ICD-10-CM | POA: Diagnosis not present

## 2018-12-20 DIAGNOSIS — L570 Actinic keratosis: Secondary | ICD-10-CM | POA: Diagnosis not present

## 2019-01-11 DIAGNOSIS — Z23 Encounter for immunization: Secondary | ICD-10-CM | POA: Diagnosis not present

## 2019-02-24 ENCOUNTER — Encounter: Payer: Self-pay | Admitting: Allergy

## 2019-02-24 ENCOUNTER — Ambulatory Visit (INDEPENDENT_AMBULATORY_CARE_PROVIDER_SITE_OTHER): Payer: BC Managed Care – PPO | Admitting: Allergy

## 2019-02-24 ENCOUNTER — Other Ambulatory Visit: Payer: Self-pay

## 2019-02-24 VITALS — BP 124/78 | HR 62 | Temp 96.8°F | Resp 16 | Ht 69.88 in | Wt 191.8 lb

## 2019-02-24 DIAGNOSIS — Z87898 Personal history of other specified conditions: Secondary | ICD-10-CM | POA: Insufficient documentation

## 2019-02-24 DIAGNOSIS — T7840XA Allergy, unspecified, initial encounter: Secondary | ICD-10-CM | POA: Insufficient documentation

## 2019-02-24 DIAGNOSIS — T7840XD Allergy, unspecified, subsequent encounter: Secondary | ICD-10-CM | POA: Diagnosis not present

## 2019-02-24 DIAGNOSIS — R0602 Shortness of breath: Secondary | ICD-10-CM

## 2019-02-24 DIAGNOSIS — L282 Other prurigo: Secondary | ICD-10-CM | POA: Insufficient documentation

## 2019-02-24 NOTE — Progress Notes (Signed)
New Patient Note  RE: Jordan Roth MRN: [id number] DOB: [birth year] Date of Office Visit: 02/24/2019  Referring provider: Celene Squibb, MD Primary care provider: Celene Squibb, MD  Chief Complaint: Allergic Reaction  History of Present Illness: I had the pleasure of seeing Jordan Roth for initial evaluation at the [facility] on 02/24/2019. He is a 63 y.o. male, who is referred here by Celene Squibb, MD for the evaluation of allergic reactions.  Allergic reactions:  The first reaction occurred a few weeks after a knee replacement in October 2019. He had whole body pruritus and rash. He took alcohol wipes which did not help.  He took some benadryl and the pruritic rash improved after a few hours.  He was on multiple new medications at that time including pain medications, antibiotics. He called his surgeon and was told to stop one of his medications but he is not sure which one.   The second reaction occurred about 6 months later, he woke up at night itching and noticed some rash. Describes the rash as red, raised small bumps. He took benadryl and symptoms resolved within a few hours. This was the mildest reaction out of the 3.  Denies any changes in diet, medications, personal care products, or recent infections.  The third reaction occurred in September which was the worst. He was bush hogging and he has been doing this for many years in the same area with no issues. He got into a thicker grass area and he started to itch. Initially he felt like he was getting bit by something. Then he had some shortness of breath. He went home to take a shower and he felt like his throat was closing up. So he told his wife to call 911 and took some benadryl. He was told he had low oxygen level by EMS but only was given additional benadryl and did not go to ER.   Denies any changes at that time either but he does remember eating a steak for dinner before he went out to do yardwork. He  had multiple tick bites. He had red meat since then with no issues.   Occasionally he has some milder itching on his legs.   Patient is up to date with the following cancer screening tests: colonoscopy with polyps, prostate.  Assessment and Plan: Jordan Roth is a 63 y.o. male with: Allergic reaction 3 allergic reactions within the last year. First occurred after a knee replacement surgery and was on multiple new medications including pain and antibiotics. Developed pruritic rash and resolved with benadryl within a few hours. Second reaction occurred at night and woke up with a pruritic rash. Resolved with benadryl. Third reaction was the most severe which required EMS but only was treated with benadryl for shortness of breath, throat tightness and pruritic rash. Patient is not sure of any triggers. Some days has milder pruritic rash on legs only. Nobody else has rash at home. Up to date with colonoscopy and prostate check.  Discussed with patient that not sure what are triggering/causing these episodes. Will need to get bloodwork to further evaluate.   Meanwhile avoid red meat for now due to concerns for possible alpha-gal allergy.   For mild symptoms you can take over the counter antihistamines such as Benadryl and monitor symptoms closely. If symptoms worsen or if you have severe symptoms including breathing issues, throat closure, significant swelling, whole body hives, severe diarrhea and vomiting, lightheadedness then inject  epinephrine and seek immediate medical care afterwards.  Emergency action plan given and demonstrated proper epinephrine use.   Take a daily antihistamine such as Zyrtec (cetirizine), Claritin (loratadine), Allegra (fexofenadine), or Xyzal (levocetirizine) daily and see if the rash and itching improves.  . Monitor symptoms. If you have another reaction, write down what you had eaten/come across with within the 6-8 hours. Take pictures of the rash.  . Take Tylenol for pain  instead of NSAIDs.  . Limit alcohol intake.   Pruritic rash  Monitor symptoms and see if it improves with a daily antihistamine as above.   History of shortness of breath Sudden shortness of breath episode with last reaction. Strong family history of COPD.  Today's spirometry was normal.  Monitor symptoms.   Return in about 4 months (around 06/25/2019).  Lab Orders     Alpha-Gal Panel     Tryptase     Allergens w/Total IgE Area 2     Allergen Hymenoptera Panel     Allergen Fire Ant     CBC w/Diff/Platelet     Comprehensive Metabolic Panel (CMET)     Sed Rate (ESR)     Thyroid Cascade Profile     ANA w/Reflex     C3 and C4  Other allergy screening: Asthma: no Rhino conjunctivitis: no Food allergy: no Medication allergy: yes  Cephalexin - hives Tetracyclines - hives Hymenoptera allergy: no Urticaria: yes Eczema:no History of recurrent infections suggestive of immunodeficency: no  Diagnostics: Spirometry:  Tracings reviewed. His effort: Good reproducible efforts. FVC: 4.52L FEV1: 3.53L, 101% predicted FEV1/FVC ratio: 78% Interpretation: Spirometry consistent with normal pattern.  Please see scanned spirometry results for details.  Skin Testing: None.  Past Medical History: Patient Active Problem List   Diagnosis Date Noted  . Pruritic rash 02/24/2019  . History of shortness of breath 02/24/2019  . Allergic reaction 02/24/2019  . GERD (gastroesophageal reflux disease) 11/03/2018  . History of Barrett's esophagus 11/03/2018  . Gastroesophageal reflux disease 11/03/2018  . Colon cancer screening 11/03/2018  . OA (osteoarthritis) of knee 01/11/2018  . Gout 12/20/2015  . COPD (chronic obstructive pulmonary disease) ([place]) 12/20/2015  . Hyperlipidemia 12/20/2015  . Sleep apnea 12/20/2015  . Angina pectoris ([place]) 12/20/2015   Past Medical History:  Diagnosis Date  . Angina pectoris ([place]) 2017   ABNORMAL STRESS TEST CATH DONE AT [facility] RESULTS OK  . Cancer  ([place]) 2000   Pre-esophogeal  . COPD (chronic obstructive pulmonary disease) ([place])   . Gout   . History of kidney stones   . Hyperlipidemia   . Muscle cramps at night   . Osteoarthrosis    OA  . Raynaud's disease    fingers  . Sleep apnea    NO CPAP USE LAST 6 MONTHS DUE TO IRRITATES NOSE   Past Surgical History: Past Surgical History:  Procedure Laterality Date  . BIOPSY  12/10/2018   Procedure: BIOPSY;  Surgeon: Rogene Houston, MD;  [facility];  Service: Endoscopy;;  esophagus  . CARDIAC CATHETERIZATION  2017 [facility]  . COLONOSCOPY    . COLONOSCOPY WITH PROPOFOL N/A 12/10/2018   Procedure: COLONOSCOPY WITH PROPOFOL;  Surgeon: Rogene Houston, MD;  [facility];  Service: Endoscopy;  Laterality: N/A;  . ESOPHAGOGASTRODUODENOSCOPY (EGD) WITH PROPOFOL N/A 12/10/2018   Procedure: ESOPHAGOGASTRODUODENOSCOPY (EGD) WITH PROPOFOL;  Surgeon: Rogene Houston, MD;  [facility];  Service: Endoscopy;  Laterality: N/A;  145pm-office moved to 12:35pm  . POLYPECTOMY  12/10/2018   Procedure: POLYPECTOMY;  Surgeon: Rogene Houston, MD;  Location: [facility];  Service: Endoscopy;;  colon  . surgery for barretts esophagus  2000  . surgery for kidney stone  2009 or 2010  . TOTAL KNEE ARTHROPLASTY Right 01/11/2018   Procedure: RIGHT TOTAL KNEE ARTHROPLASTY;  Surgeon: Gaynelle Arabian, MD;  Location: [facility];  Service: Orthopedics;  Laterality: Right;   Medication List:  Current Outpatient Medications  Medication Sig Dispense Refill  . aspirin EC 81 MG tablet Take 1 tablet (81 mg total) by mouth daily.    Marland Kitchen atorvastatin (LIPITOR) 20 MG tablet Take 20 mg by mouth at bedtime.     . Coenzyme Q10 (CO Q-10) 200 MG CAPS Take 200 mg by mouth at bedtime.     Marland Kitchen EPINEPHrine 0.3 mg/0.3 mL IJ SOAJ injection     . Omega-3 Fatty Acids (FISH OIL) 1000 MG CAPS Take 1,000 mg by mouth at bedtime.      No current facility-administered medications for this visit.   Allergies:  Allergies  Allergen Reactions  . Cephalexin Hives  . Tetracyclines & Related Rash   Social History: Social History   Socioeconomic History  . Marital status: Married    Spouse name: Not on file  . Number of children: Not on file  . Years of education: Not on file  . Highest education level: Not on file  Occupational History  . Not on file  Tobacco Use  . Smoking status: Former Smoker    Years: 10.00    Types: Cigarettes  . Smokeless tobacco: Never Used  . Tobacco comment: quit 1981  Substance and Sexual Activity  . Alcohol use: Yes    Comment: amount varis daily from none to six beer  . Drug use: No  . Sexual activity: Yes    Partners: Female  Other Topics Concern  . Not on file  Social History Narrative  . Not on file   Social Determinants of Health   Financial Resource Strain:   . Difficulty of Paying Living Expenses: Not on file  Food Insecurity:   . Worried About Charity fundraiser in the Last Year: Not on file  . Ran Out of Food in the Last Year: Not on file  Transportation Needs:   . Lack of Transportation (Medical): Not on file  . Lack of Transportation (Non-Medical): Not on file  Physical Activity:   . Days of Exercise per Week: Not on file  . Minutes of Exercise per Session: Not on file  Stress:   . Feeling of Stress : Not on file  Social Connections:   . Frequency of Communication with Friends and Family: Not on file  . Frequency of Social Gatherings with Friends and Family: Not on file  . Attends Religious Services: Not on file  . Active Member of Clubs or Organizations: Not on file  . Attends Archivist Meetings: Not on file  . Marital Status: Not on file   Lives in a 64 year old house. Smoking: denies Occupation: retired  Programme researcher, broadcasting/film/video HistoryFreight forwarder in the house: no Charity fundraiser in the family room: no Carpet in the bedroom: yes Heating: gas and electric Cooling: central Pet: no  Family History: Family History   Problem Relation Age of Onset  . COPD Mother    Problem  Relation Asthma                                   No  Eczema                                No  Food allergy                          No  Allergic rhino conjunctivitis     No  Allergic reaction  No  Review of Systems  Constitutional: Negative for appetite change, chills, fever and unexpected weight change.  HENT: Negative for congestion and rhinorrhea.   Eyes: Negative for itching.  Respiratory: Negative for cough, chest tightness, shortness of breath and wheezing.   Cardiovascular: Negative for chest pain.  Gastrointestinal: Negative for abdominal pain.  Genitourinary: Negative for difficulty urinating.  Skin: Positive for rash.  Neurological: Negative for headaches.   Objective: BP 124/78 (BP Location: Right Arm, Patient Position: Sitting, Cuff Size: Normal)   Pulse 62   Temp (!) 96.8 F (36 C) (Temporal)   Resp 16   Ht 5' 9.88" (1.775 m)   Wt 191 lb 12 oz (87 kg)   SpO2 96%   BMI 27.61 kg/m  Body mass index is 27.61 kg/m. Physical Exam  Constitutional: He is oriented to person, place, and time. He appears well-developed and well-nourished.  HENT:  Head: Normocephalic and atraumatic.  Right Ear: External ear normal.  Left Ear: External ear normal.  Nose: Nose normal.  Mouth/Throat: Oropharynx is clear and moist.  Eyes: Conjunctivae and EOM are normal.  Cardiovascular: Normal rate, regular rhythm and normal heart sounds. Exam reveals no gallop and no friction rub.  No murmur heard. Pulmonary/Chest: Effort normal and breath sounds normal. He has no wheezes. He has no rales.  Abdominal: Soft.  Musculoskeletal:     Cervical back: Neck supple.  Neurological: He is alert and oriented to person, place, and time.  Skin: Skin is warm. Rash noted.  A few 2-45m in diameter erythematous circular rash on ankle area right > left.   Psychiatric: He has a normal mood and affect. His behavior  is normal.  Nursing note and vitals reviewed.  The plan was reviewed with the patient/family, and all questions/concerned were addressed.  It was my pleasure to see Jordan Roth and participate in his care. Please feel free to contact me with any questions or concerns.  Sincerely,  YRexene Alberts DO Allergy & Immunology  [facility]: [phone] LLCoffice: 3[place]office: [phone]

## 2019-02-24 NOTE — Patient Instructions (Addendum)
.   Get bloodwork:  o We are ordering labs, so please allow 1-2 weeks for the results to come back. o With the newly implemented Cures Act, the labs might be visible to you at the same time that they become visible to me. However, I will not address the results until all of the results are back, so please be patient.  o In the meantime, continue recommendations in your patient instructions, including avoidance measures (if applicable), until you hear from me.   Avoid red meat for now.  For mild symptoms you can take over the counter antihistamines such as Benadryl and monitor symptoms closely. If symptoms worsen or if you have severe symptoms including breathing issues, throat closure, significant swelling, whole body hives, severe diarrhea and vomiting, lightheadedness then inject epinephrine and seek immediate medical care afterwards.  Emergency action plan given.    Take a daily antihistamine such as Zyrtec (cetirizine), Claritin (loratadine), Allegra (fexofenadine), or Xyzal (levocetirizine) daily and see if the rash and itching improves.   . Monitor symptoms. If you have another reaction, write down what you had eaten/come across with within the 6-8 hours. Take pictures of the rash.  . Take Tylenol for pain instead of NSAIDs.  . Limit alcohol intake.   Follow up in 4 months or sooner if needed in our [place] office with Dr. Ernst Bowler.

## 2019-02-24 NOTE — Assessment & Plan Note (Signed)
3 allergic reactions within the last year. First occurred after a knee replacement surgery and was on multiple new medications including pain and antibiotics. Developed pruritic rash and resolved with benadryl within a few hours. Second reaction occurred at night and woke up with a pruritic rash. Resolved with benadryl. Third reaction was the most severe which required EMS but only was treated with benadryl for shortness of breath, throat tightness and pruritic rash. Patient is not sure of any triggers. Some days has milder pruritic rash on legs only. Nobody else has rash at home. Up to date with colonoscopy and prostate check.  Discussed with patient that not sure what are triggering/causing these episodes. Will need to get bloodwork to further evaluate.   Meanwhile avoid red meat for now due to concerns for possible alpha-gal allergy.   For mild symptoms you can take over the counter antihistamines such as Benadryl and monitor symptoms closely. If symptoms worsen or if you have severe symptoms including breathing issues, throat closure, significant swelling, whole body hives, severe diarrhea and vomiting, lightheadedness then inject epinephrine and seek immediate medical care afterwards.  Emergency action plan given and demonstrated proper epinephrine use.   Take a daily antihistamine such as Zyrtec (cetirizine), Claritin (loratadine), Allegra (fexofenadine), or Xyzal (levocetirizine) daily and see if the rash and itching improves.  . Monitor symptoms. If you have another reaction, write down what you had eaten/come across with within the 6-8 hours. Take pictures of the rash.  . Take Tylenol for pain instead of NSAIDs.  . Limit alcohol intake.

## 2019-02-24 NOTE — Assessment & Plan Note (Signed)
Sudden shortness of breath episode with last reaction. Strong family history of COPD.  Today's spirometry was normal.  Monitor symptoms.

## 2019-02-24 NOTE — Assessment & Plan Note (Signed)
   Monitor symptoms and see if it improves with a daily antihistamine as above.

## 2019-03-03 DIAGNOSIS — Z125 Encounter for screening for malignant neoplasm of prostate: Secondary | ICD-10-CM | POA: Diagnosis not present

## 2019-03-03 DIAGNOSIS — R944 Abnormal results of kidney function studies: Secondary | ICD-10-CM | POA: Diagnosis not present

## 2019-03-03 DIAGNOSIS — R945 Abnormal results of liver function studies: Secondary | ICD-10-CM | POA: Diagnosis not present

## 2019-03-03 DIAGNOSIS — R7301 Impaired fasting glucose: Secondary | ICD-10-CM | POA: Diagnosis not present

## 2019-03-03 DIAGNOSIS — E782 Mixed hyperlipidemia: Secondary | ICD-10-CM | POA: Diagnosis not present

## 2019-03-09 DIAGNOSIS — E782 Mixed hyperlipidemia: Secondary | ICD-10-CM | POA: Diagnosis not present

## 2019-03-09 DIAGNOSIS — R944 Abnormal results of kidney function studies: Secondary | ICD-10-CM | POA: Diagnosis not present

## 2019-03-09 DIAGNOSIS — R7301 Impaired fasting glucose: Secondary | ICD-10-CM | POA: Diagnosis not present

## 2019-03-09 DIAGNOSIS — R945 Abnormal results of liver function studies: Secondary | ICD-10-CM | POA: Diagnosis not present

## 2019-04-19 ENCOUNTER — Ambulatory Visit: Payer: Medicare Other | Attending: Internal Medicine

## 2019-04-19 ENCOUNTER — Other Ambulatory Visit: Payer: Self-pay

## 2019-04-19 DIAGNOSIS — Z20822 Contact with and (suspected) exposure to covid-19: Secondary | ICD-10-CM

## 2019-04-20 ENCOUNTER — Telehealth: Payer: Self-pay | Admitting: *Deleted

## 2019-04-20 LAB — NOVEL CORONAVIRUS, NAA: SARS-CoV-2, NAA: NOT DETECTED

## 2019-04-20 NOTE — Telephone Encounter (Signed)
Pt notified that his covid test result is negative. He voiced understanding.

## 2019-05-17 ENCOUNTER — Other Ambulatory Visit: Payer: Self-pay

## 2019-05-17 ENCOUNTER — Ambulatory Visit: Payer: Medicare Other | Attending: Internal Medicine

## 2019-05-17 DIAGNOSIS — Z23 Encounter for immunization: Secondary | ICD-10-CM

## 2019-06-14 ENCOUNTER — Ambulatory Visit: Payer: Medicare Other | Attending: Internal Medicine

## 2019-06-14 DIAGNOSIS — Z23 Encounter for immunization: Secondary | ICD-10-CM

## 2019-06-14 NOTE — Progress Notes (Signed)
   Covid-19 Vaccination Clinic  Name:  JEDRICK BRODEN    MRN: [id number] DOB: [birth year]  06/14/2019  Mr. Brosky was observed post Covid-19 immunization for 15 minutes without incident. He was provided with Vaccine Information Sheet and instruction to access the V-Safe system.   Mr. Delacueva was instructed to call 911 with any severe reactions post vaccine: Marland Kitchen Difficulty breathing  . Swelling of face and throat  . A fast heartbeat  . A bad rash all over body  . Dizziness and weakness   Immunizations Administered    Name Date Dose VIS Date Route   Moderna COVID-19 Vaccine 06/14/2019 10:09 AM 0.5 mL 02/15/2019 Intramuscular   Manufacturer: Moderna   Lot: [id number]   [place]:[id number]

## 2019-06-29 ENCOUNTER — Ambulatory Visit: Payer: BC Managed Care – PPO | Admitting: Allergy & Immunology

## 2020-02-06 ENCOUNTER — Other Ambulatory Visit (HOSPITAL_COMMUNITY): Payer: Self-pay | Admitting: Urology

## 2020-02-06 ENCOUNTER — Other Ambulatory Visit: Payer: Self-pay | Admitting: Urology

## 2020-02-06 DIAGNOSIS — C61 Malignant neoplasm of prostate: Secondary | ICD-10-CM

## 2020-02-17 ENCOUNTER — Other Ambulatory Visit: Payer: Self-pay

## 2020-02-17 ENCOUNTER — Encounter (HOSPITAL_COMMUNITY)
Admission: RE | Admit: 2020-02-17 | Discharge: 2020-02-17 | Disposition: A | Discharge status: Home / Self Care | Payer: Medicare Other | Source: Ambulatory Visit | Attending: Urology | Admitting: Urology

## 2020-02-17 DIAGNOSIS — C61 Malignant neoplasm of prostate: Secondary | ICD-10-CM | POA: Insufficient documentation

## 2020-02-17 MED ORDER — TECHNETIUM TC 99M MEDRONATE IV KIT
21.0000 | PACK | Freq: Once | INTRAVENOUS | Status: AC
  Administered 2020-02-17: 21 via INTRAVENOUS

## 2020-02-23 ENCOUNTER — Other Ambulatory Visit: Payer: Self-pay | Admitting: Urology

## 2020-02-27 ENCOUNTER — Encounter: Payer: Self-pay | Admitting: Radiation Oncology

## 2020-02-27 NOTE — Progress Notes (Signed)
GU Location of Tumor / Histology: prostatic adenocarcinoma  If Prostate Cancer, Gleason Score is (4 + 5) and PSA is (4.98). Prostate volume: [name] explains he learned from his PCP in December 2020 his PSA was 2.5. Patient reports he was asymptomatic and told to return in six month. Patient explains his PSA doubled during that time and he requested referral to Dr. Lovena Neighbours.  Biopsies of prostate (if applicable) revealed:   Past/Anticipated interventions by urology, if any: prostate biopsy, CT abd/pelvis, bone scan (negative), on tamsulosin (no longer taking), referral to Dr. Tammi Klippel to discuss radiation options, scheduled for prostatectomy with Dr. Alinda Money on 04/07/20  Past/Anticipated interventions by medical oncology, if any: no  Weight changes, if any: denies  Bowel/Bladder complaints, if any: IPSS 15. SHIM 20. Denies dysuria, hematuria, urinary leakage or incontinence. Denies any bowel complaints.   Nausea/Vomiting, if any: denies  Pain issues, if any:  denies  SAFETY ISSUES:  Prior radiation? denies  Pacemaker/ICD? denies  Possible current pregnancy? no, male patient  Is the patient on methotrexate? denies  Current Complaints / other details:  64 year old male. Married. Resides in [place]. 1 daughter and 1 son (deceased). Retired.

## 2020-02-28 ENCOUNTER — Encounter: Payer: Self-pay | Admitting: Medical Oncology

## 2020-02-28 ENCOUNTER — Ambulatory Visit
Admission: RE | Admit: 2020-02-28 | Discharge: 2020-02-28 | Disposition: A | Discharge status: Home / Self Care | Payer: Medicare Other | Source: Ambulatory Visit | Attending: Radiation Oncology | Admitting: Radiation Oncology

## 2020-02-28 ENCOUNTER — Other Ambulatory Visit: Payer: Self-pay

## 2020-02-28 ENCOUNTER — Encounter: Payer: Self-pay | Admitting: Radiation Oncology

## 2020-02-28 VITALS — BP 142/79 | HR 75 | Temp 97.0°F | Resp 18 | Ht 71.0 in | Wt 201.2 lb

## 2020-02-28 DIAGNOSIS — I7300 Raynaud's syndrome without gangrene: Secondary | ICD-10-CM | POA: Insufficient documentation

## 2020-02-28 DIAGNOSIS — M1990 Unspecified osteoarthritis, unspecified site: Secondary | ICD-10-CM | POA: Diagnosis not present

## 2020-02-28 DIAGNOSIS — C61 Malignant neoplasm of prostate: Secondary | ICD-10-CM

## 2020-02-28 DIAGNOSIS — E785 Hyperlipidemia, unspecified: Secondary | ICD-10-CM | POA: Diagnosis not present

## 2020-02-28 DIAGNOSIS — G4730 Sleep apnea, unspecified: Secondary | ICD-10-CM | POA: Diagnosis not present

## 2020-02-28 DIAGNOSIS — I208 Other forms of angina pectoris: Secondary | ICD-10-CM | POA: Diagnosis not present

## 2020-02-28 DIAGNOSIS — Z7982 Long term (current) use of aspirin: Secondary | ICD-10-CM | POA: Diagnosis not present

## 2020-02-28 DIAGNOSIS — Z87442 Personal history of urinary calculi: Secondary | ICD-10-CM | POA: Insufficient documentation

## 2020-02-28 DIAGNOSIS — Z79899 Other long term (current) drug therapy: Secondary | ICD-10-CM | POA: Insufficient documentation

## 2020-02-28 DIAGNOSIS — Z87891 Personal history of nicotine dependence: Secondary | ICD-10-CM | POA: Diagnosis not present

## 2020-02-28 DIAGNOSIS — J449 Chronic obstructive pulmonary disease, unspecified: Secondary | ICD-10-CM | POA: Diagnosis not present

## 2020-02-28 HISTORY — DX: Malignant neoplasm of prostate: C61

## 2020-02-28 NOTE — Progress Notes (Signed)
Radiation Oncology         [phone] ________________________________  Initial outpatient Consultation  Name: Jordan Roth MRN: [id number]  Date: 02/28/2020  DOB: [birth year]  ER:XVQM, Edwinna Areola, MD  Davis Gourd*   REFERRING PHYSICIAN: Davis Gourd*  DIAGNOSIS: 64 y.o. gentleman with Stage T1c adenocarcinoma of the prostate with Gleason score of 4+5, and PSA of 4.98.    ICD-10-CM   1. Malignant neoplasm of prostate ([place])  C61     HISTORY OF PRESENT ILLNESS: Jordan Roth is a 64 y.o. male with a diagnosis of prostate cancer. He was noted to have an elevated PSA of 4.9 by his primary care physician, Dr. Gerarda Fraction.  Accordingly, he was referred for evaluation in urology by Dr. Lovena Neighbours on 01/05/2020,  digital rectal examination was performed at that time revealing no nodules.  Repeat PSA from that day showed stability at 4.98  The patient proceeded to transrectal ultrasound with 12 biopsies of the prostate on 01/26/2020.  The prostate volume measured 19.1 cc.  Out of 12 core biopsies, 3 were positive.  The maximum Gleason score was 4+5, and this was seen in left mid and left mid lateral. Additionally, Gleason 4+3 was seen in left base lateral. All 3 positive cores showed PNI.  He underwent staging studies consisting of bone scan and CT A/P on 02/17/2020. Both were negative for evidence of metastatic disease.  He met with Dr. Alinda Money on 02/14/2020 to discuss surgical treatment options and is currently scheduled for RALP on 03/22/2020 under the care of Dr. Alinda Money but wanted to learn more about his radiation options prior to moving forward.  The patient reviewed the biopsy results with his urologist and he has kindly been referred today for discussion of potential radiation treatment options.   PREVIOUS RADIATION THERAPY: No  PAST MEDICAL HISTORY:  Past Medical History:  Diagnosis Date  . Angina pectoris ([place]) 2017   ABNORMAL STRESS TEST CATH DONE AT [facility] RESULTS OK  .  Cancer ([place]) 2000   Pre-esophogeal  . COPD (chronic obstructive pulmonary disease) ([place])   . Gout   . History of kidney stones   . Hyperlipidemia   . Muscle cramps at night   . Osteoarthrosis    OA  . Prostate cancer ([place])   . Raynaud's disease    fingers  . Sleep apnea    NO CPAP USE LAST 6 MONTHS DUE TO IRRITATES NOSE      PAST SURGICAL HISTORY: Past Surgical History:  Procedure Laterality Date  . BIOPSY  12/10/2018   Procedure: BIOPSY;  Surgeon: Rogene Houston, MD;  [facility];  Service: Endoscopy;;  esophagus  . CARDIAC CATHETERIZATION  2017 [facility]  . COLONOSCOPY    . COLONOSCOPY WITH PROPOFOL N/A 12/10/2018   Procedure: COLONOSCOPY WITH PROPOFOL;  Surgeon: Rogene Houston, MD;  [facility];  Service: Endoscopy;  Laterality: N/A;  . ESOPHAGOGASTRODUODENOSCOPY (EGD) WITH PROPOFOL N/A 12/10/2018   Procedure: ESOPHAGOGASTRODUODENOSCOPY (EGD) WITH PROPOFOL;  Surgeon: Rogene Houston, MD;  [facility];  Service: Endoscopy;  Laterality: N/A;  145pm-office moved to 12:35pm  . POLYPECTOMY  12/10/2018   Procedure: POLYPECTOMY;  Surgeon: Rogene Houston, MD;  Location[facility];  Service: Endoscopy;;  colon  . surgery for barretts esophagus  2000  . surgery for kidney stone  2009 or 2010  . TOTAL KNEE ARTHROPLASTY Right 01/11/2018   Procedure: RIGHT TOTAL KNEE ARTHROPLASTY;  Surgeon: Gaynelle Arabian, MD;  Location: [facility];  Service: Orthopedics;  Laterality: Right;    FAMILY HISTORY:  Family History  Problem Relation Age of Onset  . COPD Mother   . Prostate cancer Neg Hx   . Colon cancer Neg Hx   . Pancreatic cancer Neg Hx   . Breast cancer Neg Hx     SOCIAL HISTORY:  Social History   Socioeconomic History  . Marital status: Married    Spouse name: Not on file  . Number of children: 2  . Years of education: Not on file  . Highest education level: Not on file  Occupational History    Comment: retired  Tobacco Use  . Smoking  status: Former Smoker    Years: 10.00    Types: Cigarettes    Quit date: 12/16/1979    Years since quitting: 40.2  . Smokeless tobacco: Never Used  . Tobacco comment: quit 1981  Vaping Use  . Vaping Use: Never used  Substance and Sexual Activity  . Alcohol use: Yes    Comment: amount varis daily from none to six beer  . Drug use: No  . Sexual activity: Yes    Partners: Female  Other Topics Concern  . Not on file  Social History Narrative   1 daughter. 1 son (deceased).   Social Determinants of Health   Financial Resource Strain: Not on file  Food Insecurity: Not on file  Transportation Needs: Not on file  Physical Activity: Not on file  Stress: Not on file  Social Connections: Not on file  Intimate Partner Violence: Not on file    ALLERGIES: Cephalexin and Tetracyclines & related  MEDICATIONS:  Current Outpatient Medications  Medication Sig Dispense Refill  . aspirin EC 81 MG tablet Take 1 tablet (81 mg total) by mouth daily.    Marland Kitchen atorvastatin (LIPITOR) 20 MG tablet Take 20 mg by mouth at bedtime.     . Coenzyme Q10 (CO Q-10) 200 MG CAPS Take 200 mg by mouth at bedtime.     Marland Kitchen EPINEPHrine 0.3 mg/0.3 mL IJ SOAJ injection     . HYDROcodone-acetaminophen (NORCO/VICODIN) 5-325 MG tablet Take 1 tablet by mouth daily as needed.    Marland Kitchen levofloxacin (LEVAQUIN) 750 MG tablet Take 750 mg by mouth daily.    Marland Kitchen ofloxacin (OCUFLOX) 0.3 % ophthalmic solution     . Omega-3 Fatty Acids (FISH OIL) 1000 MG CAPS Take 1,000 mg by mouth at bedtime.     . prednisoLONE acetate (PRED FORTE) 1 % ophthalmic suspension SMARTSIG:In Eye(s)    . tamsulosin (FLOMAX) 0.4 MG CAPS capsule Take 0.4 mg by mouth daily.     No current facility-administered medications for this encounter.    REVIEW OF SYSTEMS:  On review of systems, the patient reports that he is doing well overall. He denies any chest pain, shortness of breath, cough, fevers, chills, night sweats, unintended weight changes. He denies any  bowel disturbances, and denies abdominal pain, nausea or vomiting. He denies any new musculoskeletal or joint aches or pains. His IPSS was 15, indicating moderate urinary symptoms. His SHIM was 20, indicating he has mild erectile dysfunction. A complete review of systems is obtained and is otherwise negative.    PHYSICAL EXAM:  Wt Readings from Last 3 Encounters:  02/28/20 201 lb 4 oz (91.3 kg)  02/24/19 191 lb 12 oz (87 kg)  12/10/18 180 lb (81.6 kg)   Temp Readings from Last 3 Encounters:  02/28/20 (!) 97 F (36.1 C) (Temporal)  02/24/19 Marland Kitchen)  96.8 F (36 C) (Temporal)  12/10/18 97.8 F (36.6 C) (Oral)   BP Readings from Last 3 Encounters:  02/28/20 (!) 142/79  02/24/19 124/78  12/10/18 133/73   Pulse Readings from Last 3 Encounters:  02/28/20 75  02/24/19 62  12/10/18 (!) 48   Pain Assessment Pain Score: 0-No pain/10  In general this is a well appearing Caucasian male in no acute distress. He is alert and oriented x4 and appropriate throughout the examination. HEENT reveals that the patient is normocephalic, atraumatic. EOMs are intact. PERRLA. Skin is intact without any evidence of gross lesions. Cardiopulmonary assessment is negative for acute distress and he exhibits normal effort. The abdomen is soft, non tender, non distended. Lower extremities are negative for pretibial pitting edema, deep calf tenderness, cyanosis or clubbing.   KPS = 100  100 - Normal; no complaints; no evidence of disease. 90   - Able to carry on normal activity; minor signs or symptoms of disease. 80   - Normal activity with effort; some signs or symptoms of disease. 64   - Cares for self; unable to carry on normal activity or to do active work. 60   - Requires occasional assistance, but is able to care for most of his personal needs. 50   - Requires considerable assistance and frequent medical care. 4   - Disabled; requires special care and assistance. 42   - Severely disabled; hospital  admission is indicated although death not imminent. 73   - Very sick; hospital admission necessary; active supportive treatment necessary. 10   - Moribund; fatal processes progressing rapidly. 0     - Dead  Karnofsky DA, Abelmann Lluveras, Craver LS and [facility] [phone]) The use of the nitrogen mustards in the palliative treatment of carcinoma: with particular reference to bronchogenic carcinoma Cancer 1 634-56  LABORATORY DATA:  Lab Results  Component Value Date   WBC 12.8 (H) 01/13/2018   HGB 11.9 (L) 01/13/2018   HCT 37.0 (L) 01/13/2018   MCV 92.5 01/13/2018   PLT 202 01/13/2018   Lab Results  Component Value Date   NA 138 01/13/2018   K 3.9 01/13/2018   CL 106 01/13/2018   CO2 27 01/13/2018   Lab Results  Component Value Date   ALT 34 01/06/2018   AST 25 01/06/2018   ALKPHOS 70 01/06/2018   BILITOT 1.1 01/06/2018     RADIOGRAPHY: NM Bone Scan Whole Body  Result Date: 02/18/2020 CLINICAL DATA:  Prostate cancer, left hand and back pain, right total knee arthroplasty EXAM: NUCLEAR MEDICINE WHOLE BODY BONE SCAN TECHNIQUE: Whole body anterior and posterior images were obtained approximately 3 hours after intravenous injection of radiopharmaceutical. RADIOPHARMACEUTICALS:  21.0 mCi Technetium-74mMDP IV COMPARISON:  None. FINDINGS: Delayed whole body scintigraphic images are obtained. Uptake within the shoulders, left wrist, left metacarpophalangeal region, hips, knees, and feet bilaterally are almost certainly degenerative in nature. Right total knee arthroplasty defect noted. No abnormal foci of uptake to suggest osseous metastatic disease. Specifically, uptake of radiotracer within the calvarium. Normal soft tissue distribution. Normal uptake and excretion within the kidneys bilaterally. IMPRESSION: No evidence of osseous metastatic disease. Electronically Signed   By: AFidela SalisburyMD   On: 02/18/2020 12:48      IMPRESSION/PLAN: 1. 64y.o. gentleman with Stage T1c adenocarcinoma  of the prostate with Gleason Score of 4+5, and PSA of 4.98. We discussed the patient's workup and outlined the nature of prostate cancer in this setting. The patient's T stage,  Gleason's score, and PSA put him into the high risk group. Accordingly, he is eligible for a variety of potential treatment options including prostatectomy or LT-ADT in combination with either 8 weeks of external radiation or 5 weeks of external radiation with an upfront brachytherapy boost. We discussed the available radiation techniques, and focused on the details and logistics of delivery. We discussed and outlined the risks, benefits, short and long-term effects associated with radiotherapy and compared and contrasted these with prostatectomy. We discussed the role of SpaceOAR gel in reducing the rectal toxicity associated with radiotherapy. We also detailed the role of ADT in the treatment of high risk prostate cancer and outlined the associated side effects that could be expected with this therapy.  He appears to have a good understanding of his disease and our treatment recommendations which are of curative intent.  He was encouraged to ask questions that were answered to his stated satisfaction.  The patient focused most of his questions and interest in robotic-assisted laparoscopic radical prostatectomy.  We discussed some of the potential advantages of surgery including surgical staging, the availability of salvage radiotherapy to the prostatic fossa, and the confidence associated with immediate biochemical response. We discussed some of the potential proven indications for postoperative radiotherapy including positive margins, extracapsular extension, and seminal vesicle involvement. We also talked about some of the other potential findings leading to a recommendation for radiotherapy including a non-zero postoperative PSA and positive lymph nodes.   At the conclusion of our conversation, the patient is interested in moving  forward with prostatectomy on 03/22/2020 as scheduled. We will share our discussion with Dr. Lovena Neighbours and Dr. Alinda Money so that they may move forward with treatment planning accordingly. We enjoyed meeting with him today, and will look forward to following his progress via correspondence.    Nicholos Johns, PA-C    Tyler Pita, MD  [facility] Direct Dial: [phone]  Fax: [phone] [email or web address]  Skype  LinkedIn   This document serves as a record of services personally performed by Tyler Pita, MD and Freeman Caldron, PA-C. It was created on their behalf by Wilburn Mylar, a trained medical scribe. The creation of this record is based on the scribe's personal observations and the provider's statements to them. This document has been checked and approved by the attending provider.

## 2020-03-01 DIAGNOSIS — C61 Malignant neoplasm of prostate: Secondary | ICD-10-CM | POA: Insufficient documentation

## 2020-03-05 NOTE — Progress Notes (Signed)
Introduced myself to patient and his wife as the prostate nurse navigator and discussed my role. He is currently scheduled for robotic prostatectomy 03/22/20, with Dr. Alinda Money. He is here to discuss radiation options but mainly in case he needs radiation post surgery. After discussions with his urologist and Dr. Alinda Money, he is aware he may need radiation pending pathology results. I wished him well on surgery and asked him to reach out to me if I can assist him in any way. He voiced understanding and was very appreciative.

## 2020-03-13 NOTE — Progress Notes (Signed)
DUE TO COVID-19 ONLY ONE VISITOR IS ALLOWED TO COME WITH YOU AND STAY IN THE WAITING ROOM ONLY DURING PRE OP AND PROCEDURE DAY OF SURGERY. THE 1 VISITOR  MAY VISIT WITH YOU AFTER SURGERY IN YOUR PRIVATE ROOM DURING VISITING HOURS ONLY!  YOU NEED TO HAVE A COVID 19 TEST ON__1/05/2020 _____ @_______ , THIS TEST MUST BE DONE BEFORE SURGERY,  COVID TESTING SITE [address] [place] , IT IS ON THE RIGHT GOING OUT [address] APPROXIMATELY  2 MINUTES PAST [employer] ON THE RIGHT. ONCE YOUR COVID TEST IS COMPLETED,  PLEASE BEGIN THE QUARANTINE INSTRUCTIONS AS OUTLINED IN YOUR HANDOUT.                [facility] BALLES  03/13/2020   Your procedure is scheduled on: 03/22/2020    Report to [facility] Main  Entrance   Report to admitting at    1030 AM     Call this number if you have problems the morning of surgery [phone]    Remember: Do not eat food , candy gum or mints :After Midnight. You may have clear liquids from midnight until   0930am   Fleets enema nite before surgery.  8 ounces of magnesium citrate at 12 noon day before surgery.   CLEAR LIQUID DIET   Foods Allowed                                                                       Coffee and tea, regular and decaf                              Plain Jell-O any favor except red or purple                                            Fruit ices (not with fruit pulp)                                      Iced Popsicles                                     Carbonated beverages, regular and diet                                    Cranberry, grape and apple juices Sports drinks like Gatorade Lightly seasoned clear broth or consume(fat free) Sugar, honey syrup   _____________________________________________________________________    BRUSH YOUR TEETH MORNING OF SURGERY AND RINSE YOUR MOUTH OUT, NO CHEWING GUM CANDY OR MINTS.     Take these medicines the morning of surgery with A SIP OF WATER:  none   DO NOT TAKE ANY DIABETIC MEDICATIONS DAY OF YOUR SURGERY  You may not have any metal on your body including hair pins and              piercings  Do not wear jewelry, make-up, lotions, powders or perfumes, deodorant             Do not wear nail polish on your fingernails.  Do not shave  48 hours prior to surgery.              Men may shave face and neck.   Do not bring valuables to the hospital. [place].  Contacts, dentures or bridgework may not be worn into surgery.  Leave suitcase in the car. After surgery it may be brought to your room.     Patients discharged the day of surgery will not be allowed to drive home. IF YOU ARE HAVING SURGERY AND GOING HOME THE SAME DAY, YOU MUST HAVE AN ADULT TO DRIVE YOU HOME AND BE WITH YOU FOR 24 HOURS. YOU MAY GO HOME BY TAXI OR UBER OR ORTHERWISE, BUT AN ADULT MUST ACCOMPANY YOU HOME AND STAY WITH YOU FOR 24 HOURS.  Name and phone number of your driver:  Special Instructions: N/A              Please read over the following fact sheets you were given: _____________________________________________________________________  [facility] - Preparing for Surgery Before surgery, you can play an important role.  Because skin is not sterile, your skin needs to be as free of germs as possible.  You can reduce the number of germs on your skin by washing with CHG (chlorahexidine gluconate) soap before surgery.  CHG is an antiseptic cleaner which kills germs and bonds with the skin to continue killing germs even after washing. Please DO NOT use if you have an allergy to CHG or antibacterial soaps.  If your skin becomes reddened/irritated stop using the CHG and inform your nurse when you arrive at Short Stay. Do not shave (including legs and underarms) for at least 48 hours prior to the first CHG shower.  You may shave your face/neck. Please follow these instructions  carefully:  1.  Shower with CHG Soap the night before surgery and the  morning of Surgery.  2.  If you choose to wash your hair, wash your hair first as usual with your  normal  shampoo.  3.  After you shampoo, rinse your hair and body thoroughly to remove the  shampoo.                           4.  Use CHG as you would any other liquid soap.  You can apply chg directly  to the skin and wash                       Gently with a scrungie or clean washcloth.  5.  Apply the CHG Soap to your body ONLY FROM THE NECK DOWN.   Do not use on face/ open                           Wound or open sores. Avoid contact with eyes, ears mouth and genitals (private parts).  Wash face,  Genitals (private parts) with your normal soap.             6.  Wash thoroughly, paying special attention to the area where your surgery  will be performed.  7.  Thoroughly rinse your body with warm water from the neck down.  8.  DO NOT shower/wash with your normal soap after using and rinsing off  the CHG Soap.                9.  Pat yourself dry with a clean towel.            10.  Wear clean pajamas.            11.  Place clean sheets on your bed the night of your first shower and do not  sleep with pets. Day of Surgery : Do not apply any lotions/deodorants the morning of surgery.  Please wear clean clothes to the hospital/surgery center.  FAILURE TO FOLLOW THESE INSTRUCTIONS MAY RESULT IN THE CANCELLATION OF YOUR SURGERY PATIENT SIGNATURE_________________________________  NURSE SIGNATURE__________________________________  ________________________________________________________________________

## 2020-03-14 ENCOUNTER — Encounter (HOSPITAL_COMMUNITY): Payer: Self-pay

## 2020-03-14 ENCOUNTER — Encounter (HOSPITAL_COMMUNITY)
Admission: RE | Admit: 2020-03-14 | Discharge: 2020-03-14 | Disposition: A | Discharge status: Home / Self Care | Payer: Medicare Other | Source: Ambulatory Visit | Attending: Urology | Admitting: Urology

## 2020-03-14 ENCOUNTER — Other Ambulatory Visit: Payer: Self-pay

## 2020-03-14 DIAGNOSIS — Z01812 Encounter for preprocedural laboratory examination: Secondary | ICD-10-CM | POA: Insufficient documentation

## 2020-03-14 HISTORY — DX: Chronic kidney disease, unspecified: N18.9

## 2020-03-14 LAB — BASIC METABOLIC PANEL
Anion gap: 9 (ref 5–15)
BUN: 20 mg/dL (ref 8–23)
CO2: 26 mmol/L (ref 22–32)
Calcium: 8.9 mg/dL (ref 8.9–10.3)
Chloride: 105 mmol/L (ref 98–111)
Creatinine, Ser: 1.3 mg/dL — ABNORMAL HIGH (ref 0.61–1.24)
GFR, Estimated: >60 mL/min (ref >60)
Glucose, Bld: 92 mg/dL (ref 70–99)
Potassium: 4.4 mmol/L (ref 3.5–5.1)
Sodium: 140 mmol/L (ref 135–145)

## 2020-03-14 LAB — CBC
HCT: 47.5 % (ref 39.0–52.0)
Hemoglobin: 16 g/dL (ref 13.0–17.0)
MCH: 30.1 pg (ref 26.0–34.0)
MCHC: 33.7 g/dL (ref 30.0–36.0)
MCV: 89.5 fL (ref 80.0–100.0)
Platelets: 267 10*3/uL (ref 150–400)
RBC: 5.31 MIL/uL (ref 4.22–5.81)
RDW: 13.2 % (ref 11.5–15.5)
WBC: 5.7 10*3/uL (ref 4.0–10.5)
nRBC: 0 % (ref 0.0–0.2)

## 2020-03-19 ENCOUNTER — Other Ambulatory Visit (HOSPITAL_COMMUNITY)
Admission: RE | Admit: 2020-03-19 | Discharge: 2020-03-19 | Disposition: A | Discharge status: Home / Self Care | Payer: Medicare Other | Source: Ambulatory Visit | Attending: Urology | Admitting: Urology

## 2020-03-19 DIAGNOSIS — Z20822 Contact with and (suspected) exposure to covid-19: Secondary | ICD-10-CM | POA: Insufficient documentation

## 2020-03-19 DIAGNOSIS — Z01818 Encounter for other preprocedural examination: Secondary | ICD-10-CM | POA: Diagnosis present

## 2020-03-20 LAB — SARS CORONAVIRUS 2 (TAT 6-24 HRS): SARS Coronavirus 2: NEGATIVE

## 2020-03-21 NOTE — H&P (Signed)
CC: Prostate Cancer   Physician requesting consult: Dr. Aleen Campi  PCP: Dr. Redmond School   Jordan Roth is a 65 year old gentleman who was noted to have an elevated PSA of 4.98 prompting a TRUS biopsy of the prostate revealing Gleason 4+5=9 adenocarcinoma of the prostate with 3 out of 12 biopsy cores positive for malignancy.   Family history: None.   Imaging studies: PENDING (02/17/20)   PMH: He has a history of hyperlipidemia, urolithiasis, depression, GERD, sleep apnea, and arthritis.  PSH: He is status post a prior laparoscopic cholecystectomy in prior laparoscopic Nissen fundoplication.   TNM stage: cT1c N M  PSA: 4.98  Gleason score: 4+5=9 (GG 5)  Biopsy (01/26/20): 3/12 cores positive  Left: L lateral mid (91%, 4+5=9, PNI), L mid (60%, 4+5=9, PNI), L lateral base (75%, 4+3=7, PNI)  Right: Benign  Prostate volume: 19.1 cc   Nomogram  OC disease: 29%  EPE: 66%  SVI: 16%  LNI: 15%  PFS (5 year, 10 year): 58%, 43%   Urinary function: IPSS is 11.  Erectile function: SHIM score is 20.     ALLERGIES: No Allergies    MEDICATIONS: Hydrocodone-Acetaminophen 5 mg-325 mg tablet 1 tablet PO As Directed Take one hour prior to your scheduled procedure.  Levofloxacin 750 mg tablet 1 tablet PO Daily As Directed Start taking the day before, the day of and the day after your prostate biopsy  Lipitor 20 mg tablet  Adult Low Dose Aspirin Ec 81 mg tablet, delayed release  Fish Oil 1,200 mg (144 mg-216 mg) capsule     GU PSH: ESWL - 2011, 2011, 2011, 2010 PCNL - 2011 Prostate Needle Biopsy - 01/26/2020       PSH Notes: Lithotripsy, Lithotripsy, Percutaneous Lithotomy, Lithotripsy, Gallbladder Surgery, Abdominal Surgery, Foot Surgery, Tonsillectomy, Lithotripsy, Arm Incision, detached retina-right eye, pins in right elbow, wsophagus surgery   NON-GU PSH: Cataract Surgery.., Right Remove Gallbladder Remove Tonsils - 2010 Surgical Pathology, Gross And Microscopic Examination For  Prostate Needle - 01/26/2020 Total Knee Replacement, Right     GU PMH: Prostate Cancer, T1c, grade 5 prostate adenocarcinoma - 02/02/2020 Elevated PSA - 01/26/2020, - 01/05/2020 History of urolithiasis - 01/05/2020 Renal calculus, Staghorn Calculus - 2014      PMH Notes:  1898-03-17 00:00:00 - Note: Normal Routine History And Physical Adult  2010-03-26 09:28:19 - Note: Nephrolithiasis Of The Left Kidney   NON-GU PMH: Personal history of other diseases of the nervous system and sense organs, History of sleep apnea - 2014 Personal history of other endocrine, nutritional and metabolic disease, History of hypercholesterolemia - 2014 Personal history of other specified conditions, History of heartburn - 2014 Arthritis Depression GERD Hypercholesterolemia Sleep Apnea    FAMILY HISTORY: 1 Daughter - Daughter 1 son - Son copd - Mother Diabetes - Father Family Health Status Number - Runs In Family Hypertension - Father    Notes: *Son deceased"   SOCIAL HISTORY: Marital Status: Married Preferred Language: English; Ethnicity: Not Hispanic Or Latino; Race: White Current Smoking Status: Patient does not smoke anymore. Has not smoked since 12/16/1987. Smoked for 15 years. Smoked 2 packs per day.   Tobacco Use Assessment Completed: Used Tobacco in last 30 days? Types of alcohol consumed: Beer.  Drinks 3 caffeinated drinks per day. Patient's occupation is/was Retired.     Notes: Tobacco Use, Alcohol Use, Marital History - Currently Married, Occupation:, Caffeine Use   REVIEW OF SYSTEMS:    GU Review Male:   Patient reports frequent  urination, hard to postpone urination, and get up at night to urinate. Patient denies burning/ pain with urination, leakage of urine, stream starts and stops, trouble starting your streams, and have to strain to urinate .  Gastrointestinal (Lower):   Patient denies diarrhea and constipation.  Gastrointestinal (Upper):   Patient denies nausea and vomiting.   Constitutional:   Patient denies fever, night sweats, weight loss, and fatigue.  Skin:   Patient denies skin rash/ lesion and itching.  Eyes:   Patient denies blurred vision and double vision.  Ears/ Nose/ Throat:   Patient denies sore throat and sinus problems.  Hematologic/Lymphatic:   Patient denies swollen glands and easy bruising.  Cardiovascular:   Patient denies leg swelling and chest pains.  Respiratory:   Patient denies cough and shortness of breath.  Endocrine:   Patient denies excessive thirst.  Musculoskeletal:   Patient denies back pain and joint pain.  Neurological:   Patient denies headaches and dizziness.  Psychologic:   Patient denies depression and anxiety.   VITAL SIGNS:     Weight 175 lb / 79.38 kg  Height 71 in / 180.34 cm  BMI 24.4 kg/m    MULTI-SYSTEM PHYSICAL EXAMINATION:    Constitutional: Well-nourished. No physical deformities. Normally developed. Good grooming.  Neck: Neck symmetrical, not swollen. Normal tracheal position.  Respiratory: No labored breathing, no use of accessory muscles. Clear bilaterally.  Cardiovascular: Normal temperature, normal extremity pulses, no swelling, no varicosities. Regular rate and rhythm.  Lymphatic: No enlargement of neck, axillae, groin.  Skin: No paleness, no jaundice, no cyanosis. No lesion, no ulcer, no rash.  Neurologic / Psychiatric: Oriented to time, oriented to place, oriented to person. No depression, no anxiety, no agitation.  Gastrointestinal: No mass, no tenderness, no rigidity, non obese abdomen. He has well-healed prior laparoscopic incisions including a periumbilical incision.  Eyes: Normal conjunctivae. Normal eyelids.  Ears, Nose, Mouth, and Throat: Left ear no scars, no lesions, no masses. Right ear no scars, no lesions, no masses. Nose no scars, no lesions, no masses. Normal hearing. Normal lips.  Musculoskeletal: Normal gait and station of head and neck.     Complexity of Data:  Lab Test Review:    PSA  Records Review:   Pathology Reports, Previous Patient Records   01/05/20  PSA  Total PSA 4.98 ng/mL     ASSESSMENT:      ICD-10 Details  1 GU:   Prostate Cancer - C61    PLAN:       1. High-risk prostate cancer: I had a detailed discussion with Jordan Roth and his wife today. He does have pending staging studies scheduled for this Friday. We did briefly discuss the treatment option should he have presence of metastatic disease. Assuming that he does not, I did therapy of curative intent and we reviewed multiple options today including primary surgical therapy possibly in the context of multimodality treatment vs. External beam radiation therapy with long-term androgen deprivation vs. A clinical trial such as PROTEUS. We reviewed the pros and cons of each of these approaches.   The patient was counseled about the natural history of prostate cancer and the standard treatment options that are available for prostate cancer. It was explained to him how his age and life expectancy, clinical stage, Gleason score, and PSA affect his prognosis, the decision to proceed with additional staging studies, as well as how that information influences recommended treatment strategies. We discussed the roles for active surveillance, radiation therapy, surgical therapy, androgen  deprivation, as well as ablative therapy options for the treatment of prostate cancer as appropriate to his individual cancer situation. We discussed the risks and benefits of these options with regard to their impact on cancer control and also in terms of potential adverse events, complications, and impact on quality of life particularly related to urinary and sexual function. The patient was encouraged to ask questions throughout the discussion today and all questions were answered to his stated satisfaction. In addition, the patient was provided with and/or directed to appropriate resources and literature for further education about  prostate cancer and treatment options.   We discussed surgical therapy for prostate cancer including the different available surgical approaches. We discussed, in detail, the risks and expectations of surgery with regard to cancer control, urinary control, and erectile function as well as the expected postoperative recovery process. Additional risks of surgery including but not limited to bleeding, infection, hernia formation, nerve damage, lymphocele formation, bowel/rectal injury potentially necessitating colostomy, damage to the urinary tract resulting in urine leakage, urethral stricture, and the cardiopulmonary risks such as myocardial infarction, stroke, death, venothromboembolism, etc. were explained. The risk of open surgical conversion for robotic/laparoscopic prostatectomy was also discussed.   He does have an appointment scheduled with Dr. Kathrynn Running later in December. He appears to be heavily leaning toward surgical therapy and would like to tentatively be scheduled for this. I have encouraged to keep his appointment with Dr. Kathrynn Running to ensure that he is fully informed. Otherwise, he will be tentatively scheduled for a unilateral right nerve-sparing robot assisted laparoscopic radical prostatectomy and bilateral pelvic lymphadenectomy. This will be pending his staging study results from later this week.

## 2020-03-22 ENCOUNTER — Encounter (HOSPITAL_COMMUNITY): Payer: Self-pay | Admitting: Urology

## 2020-03-22 ENCOUNTER — Ambulatory Visit (HOSPITAL_COMMUNITY): Payer: Medicare Other | Admitting: Anesthesiology

## 2020-03-22 ENCOUNTER — Encounter (HOSPITAL_COMMUNITY)
Admission: RE | Disposition: A | Discharge status: Home / Self Care | Payer: Self-pay | Source: Home / Self Care | Attending: Urology

## 2020-03-22 ENCOUNTER — Other Ambulatory Visit: Payer: Self-pay

## 2020-03-22 ENCOUNTER — Observation Stay (HOSPITAL_COMMUNITY)
Admission: RE | Admit: 2020-03-22 | Discharge: 2020-03-23 | Disposition: A | Discharge status: Home / Self Care | Payer: Medicare Other | Attending: Urology | Admitting: Urology

## 2020-03-22 DIAGNOSIS — Z79899 Other long term (current) drug therapy: Secondary | ICD-10-CM | POA: Diagnosis not present

## 2020-03-22 DIAGNOSIS — C61 Malignant neoplasm of prostate: Secondary | ICD-10-CM | POA: Diagnosis present

## 2020-03-22 DIAGNOSIS — Z7982 Long term (current) use of aspirin: Secondary | ICD-10-CM | POA: Diagnosis not present

## 2020-03-22 DIAGNOSIS — Z87891 Personal history of nicotine dependence: Secondary | ICD-10-CM | POA: Insufficient documentation

## 2020-03-22 HISTORY — PX: ROBOT ASSISTED LAPAROSCOPIC RADICAL PROSTATECTOMY: SHX5141

## 2020-03-22 HISTORY — PX: LYMPHADENECTOMY: SHX5960

## 2020-03-22 LAB — TYPE AND SCREEN
ABO/RH(D): B POS
Antibody Screen: NEGATIVE

## 2020-03-22 LAB — HEMOGLOBIN AND HEMATOCRIT, BLOOD
HCT: 46.5 % (ref 39.0–52.0)
Hemoglobin: 15.7 g/dL (ref 13.0–17.0)

## 2020-03-22 SURGERY — XI ROBOTIC ASSISTED LAPAROSCOPIC RADICAL PROSTATECTOMY LEVEL 2
Anesthesia: General

## 2020-03-22 MED ORDER — KETOROLAC TROMETHAMINE 15 MG/ML IJ SOLN: 15.0000 mg | Freq: Four times a day (QID) | INTRAMUSCULAR | Status: DC

## 2020-03-22 MED ORDER — KCL IN DEXTROSE-NACL 20-5-0.45 MEQ/L-%-% IV SOLN
INTRAVENOUS | Status: DC
  Filled 2020-03-22: qty 1000

## 2020-03-22 MED ORDER — KCL IN DEXTROSE-NACL 20-5-0.45 MEQ/L-%-% IV SOLN
INTRAVENOUS | Status: AC
  Filled 2020-03-22: qty 1000

## 2020-03-22 MED ORDER — ROCURONIUM BROMIDE 10 MG/ML (PF) SYRINGE
PREFILLED_SYRINGE | INTRAVENOUS | Status: AC
  Filled 2020-03-22: qty 10

## 2020-03-22 MED ORDER — BELLADONNA ALKALOIDS-OPIUM 16.2-60 MG RE SUPP: 1.0000 | Freq: Four times a day (QID) | RECTAL | Status: DC | PRN

## 2020-03-22 MED ORDER — BACITRACIN-NEOMYCIN-POLYMYXIN 400-5-5000 EX OINT
1.0000 "application " | TOPICAL_OINTMENT | Freq: Three times a day (TID) | CUTANEOUS | Status: DC | PRN
  Filled 2020-03-22: qty 1

## 2020-03-22 MED ORDER — ACETAMINOPHEN 325 MG PO TABS: 650.0000 mg | ORAL_TABLET | ORAL | Status: DC | PRN

## 2020-03-22 MED ORDER — PROPOFOL 10 MG/ML IV BOLUS
INTRAVENOUS | Status: AC
  Filled 2020-03-22: qty 40

## 2020-03-22 MED ORDER — OXYCODONE HCL 5 MG PO TABS: 5.0000 mg | ORAL_TABLET | Freq: Once | ORAL | Status: DC | PRN

## 2020-03-22 MED ORDER — BUPIVACAINE-EPINEPHRINE (PF) 0.25% -1:200000 IJ SOLN
INTRAMUSCULAR | Status: DC | PRN
  Administered 2020-03-22: 30 mL

## 2020-03-22 MED ORDER — EPHEDRINE 5 MG/ML INJ
INTRAVENOUS | Status: AC
  Filled 2020-03-22: qty 10

## 2020-03-22 MED ORDER — KETOROLAC TROMETHAMINE 15 MG/ML IJ SOLN
INTRAMUSCULAR | Status: AC
  Administered 2020-03-22: 15 mg via INTRAVENOUS
  Filled 2020-03-22: qty 1

## 2020-03-22 MED ORDER — ROCURONIUM BROMIDE 10 MG/ML (PF) SYRINGE
PREFILLED_SYRINGE | INTRAVENOUS | Status: DC | PRN
  Administered 2020-03-22 (×2): 20 mg via INTRAVENOUS
  Administered 2020-03-22: 80 mg via INTRAVENOUS

## 2020-03-22 MED ORDER — HYDROMORPHONE HCL 1 MG/ML IJ SOLN
0.2500 mg | INTRAMUSCULAR | Status: DC | PRN
Start: 2020-03-22 — End: 2020-03-23
  Administered 2020-03-22: 0.25 mg via INTRAVENOUS

## 2020-03-22 MED ORDER — CEFAZOLIN SODIUM-DEXTROSE 2-4 GM/100ML-% IV SOLN: 2.0000 g | Freq: Once | INTRAVENOUS | Status: DC

## 2020-03-22 MED ORDER — LACTATED RINGERS IV SOLN: INTRAVENOUS | Status: DC

## 2020-03-22 MED ORDER — OXYCODONE HCL 5 MG/5ML PO SOLN: 5.0000 mg | Freq: Once | ORAL | Status: DC | PRN

## 2020-03-22 MED ORDER — ONDANSETRON HCL 4 MG/2ML IJ SOLN: 4.0000 mg | Freq: Once | INTRAMUSCULAR | Status: DC | PRN

## 2020-03-22 MED ORDER — PROPOFOL 10 MG/ML IV BOLUS
INTRAVENOUS | Status: DC | PRN
  Administered 2020-03-22: 150 mg via INTRAVENOUS

## 2020-03-22 MED ORDER — BELLADONNA ALKALOIDS-OPIUM 16.2-60 MG RE SUPP: 1.0000 | Freq: Once | RECTAL | Status: AC

## 2020-03-22 MED ORDER — CHLORHEXIDINE GLUCONATE 0.12 % MT SOLN
15.0000 mL | Freq: Once | OROMUCOSAL | Status: AC
  Administered 2020-03-22: 15 mL via OROMUCOSAL

## 2020-03-22 MED ORDER — VANCOMYCIN HCL IN DEXTROSE 1-5 GM/200ML-% IV SOLN: 1000.0000 mg | Freq: Two times a day (BID) | INTRAVENOUS | Status: AC

## 2020-03-22 MED ORDER — STERILE WATER FOR IRRIGATION IR SOLN
Status: DC | PRN
  Administered 2020-03-22: 1000 mL

## 2020-03-22 MED ORDER — LACTATED RINGERS IV SOLN
INTRAVENOUS | Status: DC | PRN
  Administered 2020-03-22: 1000 mL

## 2020-03-22 MED ORDER — VANCOMYCIN HCL IN DEXTROSE 1-5 GM/200ML-% IV SOLN
1000.0000 mg | INTRAVENOUS | Status: AC
  Administered 2020-03-22: 1000 mg via INTRAVENOUS
  Filled 2020-03-22: qty 200

## 2020-03-22 MED ORDER — SODIUM CHLORIDE 0.9 % IR SOLN
Status: DC | PRN
  Administered 2020-03-22: 1000 mL via INTRAVESICAL

## 2020-03-22 MED ORDER — LIDOCAINE 2% (20 MG/ML) 5 ML SYRINGE
INTRAMUSCULAR | Status: DC | PRN
  Administered 2020-03-22: 100 mg via INTRAVENOUS

## 2020-03-22 MED ORDER — HYDROMORPHONE HCL 1 MG/ML IJ SOLN
INTRAMUSCULAR | Status: DC | PRN
Start: 2020-03-22 — End: 2020-03-22
  Administered 2020-03-22: .5 mg via INTRAVENOUS
  Administered 2020-03-22: 1 mg via INTRAVENOUS
  Administered 2020-03-22: .5 mg via INTRAVENOUS

## 2020-03-22 MED ORDER — VANCOMYCIN HCL IN DEXTROSE 1-5 GM/200ML-% IV SOLN
INTRAVENOUS | Status: AC
  Filled 2020-03-22: qty 200

## 2020-03-22 MED ORDER — HYDROMORPHONE HCL 2 MG/ML IJ SOLN
INTRAMUSCULAR | Status: AC
  Filled 2020-03-22: qty 1

## 2020-03-22 MED ORDER — DOCUSATE SODIUM 100 MG PO CAPS
100.0000 mg | ORAL_CAPSULE | Freq: Two times a day (BID) | ORAL | Status: DC
  Administered 2020-03-23: 100 mg via ORAL
  Filled 2020-03-22 (×2): qty 1

## 2020-03-22 MED ORDER — DIPHENHYDRAMINE HCL 12.5 MG/5ML PO ELIX
12.5000 mg | ORAL_SOLUTION | Freq: Four times a day (QID) | ORAL | Status: DC | PRN
  Filled 2020-03-22: qty 10

## 2020-03-22 MED ORDER — SUGAMMADEX SODIUM 200 MG/2ML IV SOLN
INTRAVENOUS | Status: DC | PRN
  Administered 2020-03-22: 200 mg via INTRAVENOUS
  Administered 2020-03-22: 50 mg via INTRAVENOUS

## 2020-03-22 MED ORDER — MAGNESIUM CITRATE PO SOLN: 1.0000 | Freq: Once | ORAL | Status: DC

## 2020-03-22 MED ORDER — BUPIVACAINE-EPINEPHRINE (PF) 0.25% -1:200000 IJ SOLN
INTRAMUSCULAR | Status: AC
  Filled 2020-03-22: qty 30

## 2020-03-22 MED ORDER — MIDAZOLAM HCL 2 MG/2ML IJ SOLN
INTRAMUSCULAR | Status: AC
  Filled 2020-03-22: qty 2

## 2020-03-22 MED ORDER — TRAMADOL HCL 50 MG PO TABS: 50.0000 mg | ORAL_TABLET | Freq: Four times a day (QID) | ORAL | 0 refills | Status: AC | PRN

## 2020-03-22 MED ORDER — FENTANYL CITRATE (PF) 100 MCG/2ML IJ SOLN
INTRAMUSCULAR | Status: DC | PRN
  Administered 2020-03-22: 50 ug via INTRAVENOUS
  Administered 2020-03-22: 25 ug via INTRAVENOUS
  Administered 2020-03-22: 50 ug via INTRAVENOUS
  Administered 2020-03-22: 25 ug via INTRAVENOUS
  Administered 2020-03-22: 100 ug via INTRAVENOUS

## 2020-03-22 MED ORDER — HEPARIN SODIUM (PORCINE) 1000 UNIT/ML IJ SOLN
INTRAMUSCULAR | Status: AC
  Filled 2020-03-22: qty 1

## 2020-03-22 MED ORDER — HYDROMORPHONE HCL 1 MG/ML IJ SOLN
INTRAMUSCULAR | Status: AC
  Filled 2020-03-22: qty 1

## 2020-03-22 MED ORDER — MORPHINE SULFATE (PF) 4 MG/ML IV SOLN: 2.0000 mg | INTRAVENOUS | Status: DC | PRN

## 2020-03-22 MED ORDER — ZOLPIDEM TARTRATE 5 MG PO TABS: 5.0000 mg | ORAL_TABLET | Freq: Every evening | ORAL | Status: DC | PRN

## 2020-03-22 MED ORDER — FENTANYL CITRATE (PF) 250 MCG/5ML IJ SOLN
INTRAMUSCULAR | Status: AC
  Filled 2020-03-22: qty 5

## 2020-03-22 MED ORDER — DEXAMETHASONE SODIUM PHOSPHATE 10 MG/ML IJ SOLN
INTRAMUSCULAR | Status: DC | PRN
  Administered 2020-03-22: 10 mg via INTRAVENOUS

## 2020-03-22 MED ORDER — FLEET ENEMA 7-19 GM/118ML RE ENEM: 1.0000 | ENEMA | Freq: Once | RECTAL | Status: DC

## 2020-03-22 MED ORDER — SULFAMETHOXAZOLE-TRIMETHOPRIM 800-160 MG PO TABS: 1.0000 | ORAL_TABLET | Freq: Two times a day (BID) | ORAL | 0 refills | Status: AC

## 2020-03-22 MED ORDER — MIDAZOLAM HCL 5 MG/5ML IJ SOLN
INTRAMUSCULAR | Status: DC | PRN
  Administered 2020-03-22: 2 mg via INTRAVENOUS

## 2020-03-22 MED ORDER — BELLADONNA ALKALOIDS-OPIUM 16.2-60 MG RE SUPP
RECTAL | Status: AC
  Administered 2020-03-22: 1 via RECTAL
  Filled 2020-03-22: qty 1

## 2020-03-22 MED ORDER — ONDANSETRON HCL 4 MG/2ML IJ SOLN
INTRAMUSCULAR | Status: DC | PRN
  Administered 2020-03-22: 4 mg via INTRAVENOUS

## 2020-03-22 MED ORDER — SODIUM CHLORIDE 0.9 % IV BOLUS
1000.0000 mL | Freq: Once | INTRAVENOUS | Status: AC
  Administered 2020-03-22: 1000 mL via INTRAVENOUS

## 2020-03-22 MED ORDER — DIPHENHYDRAMINE HCL 50 MG/ML IJ SOLN: 12.5000 mg | Freq: Four times a day (QID) | INTRAMUSCULAR | Status: DC | PRN

## 2020-03-22 MED ORDER — LIDOCAINE HCL (PF) 2 % IJ SOLN
INTRAMUSCULAR | Status: AC
  Filled 2020-03-22: qty 5

## 2020-03-22 MED ORDER — ONDANSETRON HCL 4 MG/2ML IJ SOLN: 4.0000 mg | INTRAMUSCULAR | Status: DC | PRN

## 2020-03-22 SURGICAL SUPPLY — 62 items
ADH SKN CLS APL DERMABOND .7 (GAUZE/BANDAGES/DRESSINGS) ×2
APL PRP STRL LF DISP 70% ISPRP (MISCELLANEOUS) ×2
APL SWBSTK 6 STRL LF DISP (MISCELLANEOUS) ×2
APPLICATOR COTTON TIP 6 STRL (MISCELLANEOUS) ×2 IMPLANT
APPLICATOR COTTON TIP 6IN STRL (MISCELLANEOUS) ×3
CATH FOLEY 2WAY SLVR 18FR 30CC (CATHETERS) ×3 IMPLANT
CATH ROBINSON RED A/P 16FR (CATHETERS) ×3 IMPLANT
CATH ROBINSON RED A/P 8FR (CATHETERS) ×3 IMPLANT
CATH TIEMANN FOLEY 18FR 5CC (CATHETERS) ×3 IMPLANT
CHLORAPREP W/TINT 26 (MISCELLANEOUS) ×3 IMPLANT
CLIP VESOLOCK LG 6/CT PURPLE (CLIP) ×6 IMPLANT
COVER SURGICAL LIGHT HANDLE (MISCELLANEOUS) ×3 IMPLANT
COVER TIP SHEARS 8 DVNC (MISCELLANEOUS) ×2 IMPLANT
COVER TIP SHEARS 8MM DA VINCI (MISCELLANEOUS) ×3
COVER WAND RF STERILE (DRAPES) IMPLANT
CUTTER ECHEON FLEX ENDO 45 340 (ENDOMECHANICALS) ×3 IMPLANT
DECANTER SPIKE VIAL GLASS SM (MISCELLANEOUS) ×3 IMPLANT
DERMABOND ADVANCED (GAUZE/BANDAGES/DRESSINGS) ×1
DERMABOND ADVANCED .7 DNX12 (GAUZE/BANDAGES/DRESSINGS) ×2 IMPLANT
DRAIN CHANNEL RND F F (WOUND CARE) IMPLANT
DRAPE ARM DVNC X/XI (DISPOSABLE) ×8 IMPLANT
DRAPE COLUMN DVNC XI (DISPOSABLE) ×2 IMPLANT
DRAPE DA VINCI XI ARM (DISPOSABLE) ×12
DRAPE DA VINCI XI COLUMN (DISPOSABLE) ×3
DRAPE SURG IRRIG POUCH 19X23 (DRAPES) ×3 IMPLANT
DRSG TEGADERM 4X4.75 (GAUZE/BANDAGES/DRESSINGS) ×3 IMPLANT
ELECT PENCIL ROCKER SW 15FT (MISCELLANEOUS) IMPLANT
ELECT REM PT RETURN 15FT ADLT (MISCELLANEOUS) ×3 IMPLANT
GLOVE SURG ENC MOIS LTX SZ6.5 (GLOVE) ×3 IMPLANT
GLOVE SURG ENC TEXT LTX SZ7.5 (GLOVE) ×6 IMPLANT
GOWN STRL REUS W/TWL LRG LVL3 (GOWN DISPOSABLE) ×9 IMPLANT
HOLDER FOLEY CATH W/STRAP (MISCELLANEOUS) ×3 IMPLANT
IRRIG SUCT STRYKERFLOW 2 WTIP (MISCELLANEOUS) ×3
IRRIGATION SUCT STRKRFLW 2 WTP (MISCELLANEOUS) ×2 IMPLANT
IV LACTATED RINGERS 1000ML (IV SOLUTION) ×3 IMPLANT
KIT TURNOVER KIT A (KITS) IMPLANT
NDL SAFETY ECLIPSE 18X1.5 (NEEDLE) ×2 IMPLANT
NEEDLE HYPO 18GX1.5 SHARP (NEEDLE) ×3
PACK ROBOT UROLOGY CUSTOM (CUSTOM PROCEDURE TRAY) ×3 IMPLANT
PENCIL SMOKE EVACUATOR (MISCELLANEOUS) IMPLANT
RELOAD STAPLE 45 4.1 GRN THCK (STAPLE) ×2 IMPLANT
SEAL CANN UNIV 5-8 DVNC XI (MISCELLANEOUS) ×8 IMPLANT
SEAL XI 5MM-8MM UNIVERSAL (MISCELLANEOUS) ×12
SET TUBE SMOKE EVAC HIGH FLOW (TUBING) ×3 IMPLANT
SOLUTION ELECTROLUBE (MISCELLANEOUS) ×3 IMPLANT
STAPLE RELOAD 45 GRN (STAPLE) ×2 IMPLANT
STAPLE RELOAD 45MM GREEN (STAPLE) ×3
SUT ETHILON 3 0 PS 1 (SUTURE) ×3 IMPLANT
SUT MNCRL 3 0 RB1 (SUTURE) ×2 IMPLANT
SUT MNCRL 3 0 VIOLET RB1 (SUTURE) ×2 IMPLANT
SUT MNCRL AB 4-0 PS2 18 (SUTURE) ×6 IMPLANT
SUT MONOCRYL 3 0 RB1 (SUTURE) ×6
SUT VIC AB 0 CT1 27 (SUTURE) ×3
SUT VIC AB 0 CT1 27XBRD ANTBC (SUTURE) ×2 IMPLANT
SUT VIC AB 0 UR5 27 (SUTURE) ×3 IMPLANT
SUT VIC AB 2-0 SH 27 (SUTURE) ×3
SUT VIC AB 2-0 SH 27X BRD (SUTURE) ×2 IMPLANT
SUT VICRYL 0 UR6 27IN ABS (SUTURE) ×6 IMPLANT
SYR 27GX1/2 1ML LL SAFETY (SYRINGE) ×3 IMPLANT
TOWEL OR NON WOVEN STRL DISP B (DISPOSABLE) ×3 IMPLANT
TROCAR XCEL NON-BLD 5MMX100MML (ENDOMECHANICALS) IMPLANT
WATER STERILE IRR 1000ML POUR (IV SOLUTION) ×3 IMPLANT

## 2020-03-22 NOTE — Transfer of Care (Signed)
Immediate Anesthesia Transfer of Care Note  Patient: Jordan Roth  Procedure(s) Performed: XI ROBOTIC ASSISTED LAPAROSCOPIC RADICAL PROSTATECTOMY LEVEL 2 (N/A ) LYMPHADENECTOMY, PELVIC (Bilateral )  Patient Location: PACU  Anesthesia Type:General  Level of Consciousness: awake, alert  and patient cooperative  Airway & Oxygen Therapy: Patient Spontanous Breathing and Patient connected to face mask oxygen  Post-op Assessment: Report given to RN and Post -op Vital signs reviewed and stable  Post vital signs: Reviewed and stable  Last Vitals:  Vitals Value Taken Time  BP 154/91 03/22/20 1530  Temp    Pulse    Resp 13 03/22/20 1530  SpO2    Vitals shown include unvalidated device data.  Last Pain:  Vitals:   03/22/20 1114  PainSc: 0-No pain         Complications: No complications documented.

## 2020-03-22 NOTE — Discharge Instructions (Signed)

## 2020-03-22 NOTE — Anesthesia Postprocedure Evaluation (Signed)
Anesthesia Post Note  Patient: Jordan Roth  Procedure(s) Performed: XI ROBOTIC ASSISTED LAPAROSCOPIC RADICAL PROSTATECTOMY LEVEL 2 (N/A ) LYMPHADENECTOMY, PELVIC (Bilateral )     Patient location during evaluation: PACU Anesthesia Type: General Level of consciousness: awake and alert Pain management: pain level controlled Vital Signs Assessment: post-procedure vital signs reviewed and stable Respiratory status: spontaneous breathing, nonlabored ventilation, respiratory function stable and patient connected to nasal cannula oxygen Cardiovascular status: blood pressure returned to baseline and stable Postop Assessment: no apparent nausea or vomiting Anesthetic complications: no   No complications documented.  Last Vitals:  Vitals:   03/22/20 1530 03/22/20 1558  BP: (!) 154/91   Pulse:    Resp: 13 12  Temp:    SpO2:      Last Pain:  Vitals:   03/22/20 1603  PainSc: Asleep                 Dillen Belmontes S

## 2020-03-22 NOTE — Progress Notes (Signed)
Patient ID: Jordan Roth, male   DOB: 19-Jul-1955, 65 y.o.   MRN: [id number]  Post-op note  Subjective: The patient is doing well.  No complaints.  Objective: Vital signs in last 24 hours: Temp:  [97.8 F (36.6 C)] 97.8 F (36.6 C) (01/06 1527) Pulse Rate:  [67] 67 (01/06 1527) Resp:  [10-13] 12 (01/06 1558) BP: (148-154)/(91-95) 154/91 (01/06 1530) SpO2:  [100 %] 100 % (01/06 1527)  Intake/Output from previous day: No intake/output data recorded. Intake/Output this shift: Total I/O In: 1700 [I.V.:1700] Out: 40 [Blood:40]  Physical Exam:  General: Alert and oriented. Abdomen: Soft, Nondistended. Incisions: Clean and dry. GU: Urine clearing.  Lab Results: Recent Labs    03/22/20 1548  HGB 15.7  HCT 46.5    Assessment/Plan: POD#0   1) Continue to monitor, ambulate, IS   Jordan Roth. MD   LOS: 0 days   Jordan Roth 03/22/2020, 4:39 PM

## 2020-03-22 NOTE — Anesthesia Preprocedure Evaluation (Signed)
Anesthesia Evaluation  Patient identified by MRN, date of birth, ID band Patient awake    Reviewed: Allergy & Precautions, H&P , NPO status , Patient's Chart, lab work & pertinent test results  Airway Mallampati: II  TM Distance: >3 FB Neck ROM: Full    Dental no notable dental hx.    Pulmonary sleep apnea and Continuous Positive Airway Pressure Ventilation , former smoker,    Pulmonary exam normal breath sounds clear to auscultation       Cardiovascular negative cardio ROS Normal cardiovascular exam Rhythm:Regular Rate:Normal     Neuro/Psych negative neurological ROS  negative psych ROS   GI/Hepatic negative GI ROS, Neg liver ROS,   Endo/Other  negative endocrine ROS  Renal/GU negative Renal ROS  negative genitourinary   Musculoskeletal negative musculoskeletal ROS (+)   Abdominal   Peds negative pediatric ROS (+)  Hematology negative hematology ROS (+)   Anesthesia Other Findings   Reproductive/Obstetrics negative OB ROS                             Anesthesia Physical Anesthesia Plan  ASA: II  Anesthesia Plan: General   Post-op Pain Management:    Induction: Intravenous  PONV Risk Score and Plan: 2 and Ondansetron, Dexamethasone and Treatment may vary due to age or medical condition  Airway Management Planned: Oral ETT  Additional Equipment:   Intra-op Plan:   Post-operative Plan: Extubation in OR  Informed Consent: I have reviewed the patients History and Physical, chart, labs and discussed the procedure including the risks, benefits and alternatives for the proposed anesthesia with the patient or authorized representative who has indicated his/her understanding and acceptance.     Dental advisory given  Plan Discussed with: CRNA and Surgeon  Anesthesia Plan Comments:         Anesthesia Quick Evaluation

## 2020-03-22 NOTE — Op Note (Signed)
Preoperative diagnosis: Clinically localized adenocarcinoma of the prostate (clinical stage T1c N0 M0)  Postoperative diagnosis: Clinically localized adenocarcinoma of the prostate (clinical stage T1c N0 M0)  Procedure:  1. Robotic assisted laparoscopic radical prostatectomy (right nerve sparing) 2. Bilateral robotic assisted laparoscopic pelvic lymphadenectomy  Surgeon: Pryor Curia. M.D.  Assistant(s): Debbrah Alar, PA-C  An assistant was required for this surgical procedure.  The duties of the assistant included but were not limited to suctioning, passing suture, camera manipulation, retraction. This procedure would not be able to be performed without an Environmental consultant.   Anesthesia: General  Complications: None  EBL: 40 mL  IVF:  1500 mL crystalloid  Specimens: 1. Prostate and seminal vesicles 2. Right pelvic lymph nodes 3. Left pelvic lymph nodes  Disposition of specimens: Pathology  Drains: 1. 20 Fr coude catheter 2. # 19 Blake pelvic drain  Indication: Jordan Roth is a 65 y.o. patient with clinically localized prostate cancer.  After a thorough review of the management options for treatment of prostate cancer, he elected to proceed with surgical therapy and the above procedure(s).  We have discussed the potential benefits and risks of the procedure, side effects of the proposed treatment, the likelihood of the patient achieving the goals of the procedure, and any potential problems that might occur during the procedure or recuperation. Informed consent has been obtained.  Description of procedure:  The patient was taken to the operating room and a general anesthetic was administered. He was given preoperative antibiotics, placed in the dorsal lithotomy position, and prepped and draped in the usual sterile fashion. Next a preoperative timeout was performed. A urethral catheter was placed into the bladder and a site was selected near the umbilicus for placement of  the camera port. This was placed using a standard open Hassan technique which allowed entry into the peritoneal cavity under direct vision and without difficulty. An 8 mm port was placed and a pneumoperitoneum established. The camera was then used to inspect the abdomen and there was no evidence of any intra-abdominal injuries or other abnormalities. The remaining abdominal ports were then placed. 8 mm robotic ports were placed in the right lower quadrant, left lower quadrant, and far left lateral abdominal wall. A 5 mm port was placed in the right upper quadrant and a 12 mm port was placed in the right lateral abdominal wall for laparoscopic assistance. All ports were placed under direct vision without difficulty. The surgical cart was then docked.   Utilizing the cautery scissors, the bladder was reflected posteriorly allowing entry into the space of Retzius and identification of the endopelvic fascia and prostate. The periprostatic fat was then removed from the prostate allowing full exposure of the endopelvic fascia. The endopelvic fascia was then incised from the apex back to the base of the prostate bilaterally and the underlying levator muscle fibers were swept laterally off the prostate thereby isolating the dorsal venous complex. The dorsal vein was then stapled and divided with a 45 mm Flex Echelon stapler. Attention then turned to the bladder neck which was divided anteriorly thereby allowing entry into the bladder and exposure of the urethral catheter. The catheter balloon was deflated and the catheter was brought into the operative field and used to retract the prostate anteriorly. The posterior bladder neck was then examined and was divided allowing further dissection between the bladder and prostate posteriorly until the vasa deferentia and seminal vessels were identified. The vasa deferentia were isolated, divided, and lifted anteriorly. The seminal  vesicles were dissected down to their tips with  care to control the seminal vascular arterial blood supply. These structures were then lifted anteriorly and the space between Denonvillier's fascia and the anterior rectum was developed with a combination of sharp and blunt dissection. This isolated the vascular pedicles of the prostate.  The lateral prostatic fascia on the right side of the prostate was then sharply incised allowing release of the neurovascular bundle. The vascular pedicle of the prostate on the right side was then ligated with Weck clips between the prostate and neurovascular bundle and divided with sharp cold scissor dissection resulting in neurovascular bundle preservation. On the left side, a wide non nerve sparing dissection was performed with Weck clips used to ligate the vascular pedicle of the prostate. The neurovascular bundle on the right side was then separated off the apex of the prostate and urethra.  The urethra was then sharply transected allowing the prostate specimen to be disarticulated. The pelvis was copiously irrigated and hemostasis was ensured. There was no evidence for rectal injury.  Attention then turned to the right pelvic sidewall. The fibrofatty tissue between the external iliac vein, confluence of the iliac vessels, hypogastric artery, and Cooper's ligament was dissected free from the pelvic sidewall with care to preserve the obturator nerve. Weck clips were used for lymphostasis and hemostasis. An identical procedure was performed on the contralateral side and the lymphatic packets were removed for permanent pathologic analysis.  Attention then turned to the urethral anastomosis. A 2-0 Vicryl slip knot was placed between Denonvillier's fascia, the posterior bladder neck, and the posterior urethra to reapproximate these structures. A double-armed 3-0 Monocryl suture was then used to perform a 360 running tension-free anastomosis between the bladder neck and urethra. A new urethral catheter was then placed  into the bladder and irrigated. There were no blood clots within the bladder and the anastomosis appeared to be watertight. A #19 Blake drain was then brought through the left lateral 8 mm port site and positioned appropriately within the pelvis. It was secured to the skin with a nylon suture. The surgical cart was then undocked. The right lateral 12 mm port site was closed at the fascial level with a 0 Vicryl suture placed laparoscopically. All remaining ports were then removed under direct vision. The prostate specimen was removed intact within the Endopouch retrieval bag via the periumbilical camera port site. This fascial opening was closed with two running 0 Vicryl sutures. 0.25% Marcaine was then injected into all port sites and all incisions were reapproximated at the skin level with 4-0 Monocryl subcuticular sutures. Dermabond was applied. The patient appeared to tolerate the procedure well and without complications. The patient was able to be extubated and transferred to the recovery unit in satisfactory condition.   Moody Bruins MD

## 2020-03-22 NOTE — Anesthesia Procedure Notes (Signed)
Procedure Name: Intubation Date/Time: 03/22/2020 12:38 PM Performed by: Lavina Hamman, CRNA Pre-anesthesia Checklist: Patient identified, Emergency Drugs available, Suction available and Patient being monitored Patient Re-evaluated:Patient Re-evaluated prior to induction Oxygen Delivery Method: Circle System Utilized Preoxygenation: Pre-oxygenation with 100% oxygen Induction Type: IV induction Ventilation: Mask ventilation without difficulty Laryngoscope Size: Mac and 3 Grade View: Grade III Tube type: Oral Tube size: 7.5 mm Number of attempts: 1 Airway Equipment and Method: Stylet and Bougie stylet Placement Confirmation: ETT inserted through vocal cords under direct vision,  positive ETCO2 and breath sounds checked- equal and bilateral Secured at: 23 cm Tube secured with: Tape Dental Injury: Injury to lip  Comments: Slight tear to dry lip

## 2020-03-23 ENCOUNTER — Encounter (HOSPITAL_COMMUNITY): Payer: Self-pay | Admitting: Urology

## 2020-03-23 DIAGNOSIS — C61 Malignant neoplasm of prostate: Secondary | ICD-10-CM | POA: Diagnosis not present

## 2020-03-23 LAB — HEMOGLOBIN AND HEMATOCRIT, BLOOD
HCT: 43 % (ref 39.0–52.0)
Hemoglobin: 14.7 g/dL (ref 13.0–17.0)

## 2020-03-23 MED ORDER — TRAMADOL HCL 50 MG PO TABS: 50.0000 mg | ORAL_TABLET | Freq: Four times a day (QID) | ORAL | Status: DC | PRN

## 2020-03-23 MED ORDER — KETOROLAC TROMETHAMINE 15 MG/ML IJ SOLN
INTRAMUSCULAR | Status: AC
  Administered 2020-03-23: 15 mg via INTRAVENOUS
  Filled 2020-03-23: qty 1

## 2020-03-23 MED ORDER — BISACODYL 10 MG RE SUPP
10.0000 mg | Freq: Once | RECTAL | Status: AC
  Administered 2020-03-23: 10 mg via RECTAL
  Filled 2020-03-23: qty 1

## 2020-03-23 MED ORDER — ATORVASTATIN CALCIUM 20 MG PO TABS
20.0000 mg | ORAL_TABLET | Freq: Every day | ORAL | Status: DC
  Filled 2020-03-23: qty 1

## 2020-03-23 MED ORDER — KCL IN DEXTROSE-NACL 20-5-0.45 MEQ/L-%-% IV SOLN
INTRAVENOUS | Status: AC
  Filled 2020-03-23: qty 1000

## 2020-03-23 MED ORDER — VANCOMYCIN HCL IN DEXTROSE 1-5 GM/200ML-% IV SOLN
INTRAVENOUS | Status: AC
  Administered 2020-03-23: 1000 mg via INTRAVENOUS
  Filled 2020-03-23: qty 200

## 2020-03-23 NOTE — Discharge Summary (Signed)
  Date of admission: 03/22/2020  Date of discharge: 03/23/2020  Admission diagnosis: Prostate Cancer  Discharge diagnosis: Prostate Cancer  History and Physical: For full details, please see admission history and physical. Briefly, Jordan Roth is a 65 y.o. gentleman with localized prostate cancer.  After discussing management/treatment options, he elected to proceed with surgical treatment.  Hospital Course: JANZEN SACKS was taken to the operating room on 03/22/2020 and underwent a robotic assisted laparoscopic radical prostatectomy. He tolerated this procedure well and without complications. Postoperatively, he was able to be transferred to a regular hospital room following recovery from anesthesia.  He was able to begin ambulating the night of surgery. He remained hemodynamically stable overnight.  He had excellent urine output with appropriately minimal output from his pelvic drain and his pelvic drain was removed on POD #1.  He was transitioned to oral pain medication, tolerated a clear liquid diet, and had met all discharge criteria and was able to be discharged home later on POD#1.  Laboratory values: Recent Labs    03/22/20 1548 03/23/20 0746  HGB 15.7 14.7  HCT 46.5 43.0    Disposition: Home  Discharge instruction: He was instructed to be ambulatory but to refrain from heavy lifting, strenuous activity, or driving. He was instructed on urethral catheter care.  Discharge medications:   Allergies as of 03/23/2020      Reactions   Cephalexin Hives   Tetracyclines & Related Rash      Medication List    STOP taking these medications   aspirin EC 81 MG tablet   Fish Oil 1000 MG Caps     TAKE these medications   atorvastatin 20 MG tablet Commonly known as: LIPITOR Take 20 mg by mouth at bedtime.   EPINEPHrine 0.3 mg/0.3 mL Soaj injection Commonly known as: EPI-PEN Inject into the muscle as needed.   sulfamethoxazole-trimethoprim 800-160 MG tablet Commonly known as:  BACTRIM DS Take 1 tablet by mouth 2 (two) times daily. Start the day prior to foley removal appointment   traMADol 50 MG tablet Commonly known as: Ultram Take 1-2 tablets (50-100 mg total) by mouth every 6 (six) hours as needed for moderate pain or severe pain.       Followup: He will followup in 1 week for catheter removal and to discuss his surgical pathology results.

## 2020-03-23 NOTE — Progress Notes (Signed)
Patient ID: Jordan Roth, male   DOB: 06/21/1955, 65 y.o.   MRN: [id number]  1 Day Post-Op Subjective: The patient is doing well.  No nausea or vomiting. Pain is adequately controlled.  Objective: Vital signs in last 24 hours: Temp:  [97.7 F (36.5 C)-98.6 F (37 C)] 98 F (36.7 C) (01/07 0400) Pulse Rate:  [52-94] 52 (01/07 0400) Resp:  [7-20] 12 (01/07 0400) BP: (109-161)/(63-95) 117/63 (01/07 0400) SpO2:  [97 %-100 %] 100 % (01/07 0400)  Intake/Output from previous day: 01/06 0701 - 01/07 0700 In: 4750 [P.O.:1200; I.V.:3350; IV Piggyback:200] Out: 0177 [Urine:1600; Drains:120; Blood:40] Intake/Output this shift: Total I/O In: -  Out: 15 [Drains:15]  Physical Exam:  General: Alert and oriented. CV: RRR Lungs: Clear bilaterally. GI: Soft, Nondistended. Incisions: Clean, dry, and intact Urine: Clear Extremities: Nontender, no erythema, no edema.  Lab Results: Recent Labs    03/22/20 1548  HGB 15.7  HCT 46.5      Assessment/Plan: POD# 1 s/p robotic prostatectomy.  1) SL IVF 2) Ambulate, Incentive spirometry 3) Transition to oral pain medication 4) Dulcolax suppository 5) D/C pelvic drain 6) Plan for likely discharge later today   Pryor Curia. MD   LOS: 0 days   Dutch Gray 03/23/2020, 7:43 AM

## 2020-03-29 LAB — SURGICAL PATHOLOGY

## 2021-02-28 ENCOUNTER — Encounter: Payer: Self-pay | Admitting: Cardiology

## 2021-02-28 ENCOUNTER — Other Ambulatory Visit: Payer: Self-pay

## 2021-02-28 ENCOUNTER — Ambulatory Visit (INDEPENDENT_AMBULATORY_CARE_PROVIDER_SITE_OTHER): Payer: Medicare Other | Admitting: Cardiology

## 2021-02-28 VITALS — BP 132/66 | HR 56 | Ht 71.0 in | Wt 195.0 lb

## 2021-02-28 DIAGNOSIS — Z87891 Personal history of nicotine dependence: Secondary | ICD-10-CM | POA: Insufficient documentation

## 2021-02-28 DIAGNOSIS — E7800 Pure hypercholesterolemia, unspecified: Secondary | ICD-10-CM

## 2021-02-28 DIAGNOSIS — I209 Angina pectoris, unspecified: Secondary | ICD-10-CM | POA: Diagnosis not present

## 2021-02-28 DIAGNOSIS — R0602 Shortness of breath: Secondary | ICD-10-CM | POA: Diagnosis not present

## 2021-02-28 DIAGNOSIS — C61 Malignant neoplasm of prostate: Secondary | ICD-10-CM

## 2021-02-28 MED ORDER — METOPROLOL TARTRATE 25 MG PO TABS: ORAL_TABLET | ORAL | 0 refills | Status: AC

## 2021-02-28 NOTE — Patient Instructions (Addendum)
Medication Instructions:  Your physician recommends that you continue on your current medications as directed. Please refer to the Current Medication list given to you today.  *If you need a refill on your cardiac medications before your next appointment, please call your pharmacy*   Lab Work: BMET- Prior to CT Scan  If you have labs (blood work) drawn today and your tests are completely normal, you will receive your results only by: [place] (if you have MyChart) OR A paper copy in the mail If you have any lab test that is abnormal or we need to change your treatment, we will call you to review the results.   Testing/Procedures: Your physician has requested that you have an echocardiogram. Echocardiography is a painless test that uses sound waves to create images of your heart. It provides your doctor with information about the size and shape of your heart and how well your hearts chambers and valves are working. This procedure takes approximately one hour. There are no restrictions for this procedure.  Coronary CT Scan   Follow-Up: At [facility], you and your health needs are our priority.  As part of our continuing mission to provide you with exceptional heart care, we have created designated Provider Care Teams.  These Care Teams include your primary Cardiologist (physician) and Advanced Practice Providers (APPs -  Physician Assistants and Nurse Practitioners) who all work together to provide you with the care you need, when you need it.  We recommend signing up for the patient portal called "MyChart".  Sign up information is provided on this After Visit Summary.  MyChart is used to connect with patients for Virtual Visits (Telemedicine).  Patients are able to view lab/test results, encounter notes, upcoming appointments, etc.  Non-urgent messages can be sent to your provider as well.   To learn more about what you can do with MyChart, go to [email or web address].    Your  next appointment:   Follow UP: As Needed   Other Instructions   Your cardiac CT will be scheduled at one of the below locations:   [facility] [address] [place], [place] [address] [phone]  [place] [address] [place], [place] [address] [phone]  If scheduled at [facility], please arrive at the [facility] main entrance (entrance A) of [facility] 30 minutes prior to test start time. You can use the FREE valet parking offered at the main entrance (encouraged to control the heart rate for the test) Proceed to the [facility] Radiology Department (first floor) to check-in and test prep.  If scheduled at [facility], please arrive 15 mins early for check-in and test prep.  Please follow these instructions carefully (unless otherwise directed):  Hold all erectile dysfunction medications at least 3 days (72 hrs) prior to test. TAKE METOPROLOL TARTRATE 25 MG TABLET TWO (2) HOURS PRIOR TO CT SCAN   On the Night Before the Test: Be sure to Drink plenty of water. Do not consume any caffeinated/decaffeinated beverages or chocolate 12 hours prior to your test. Do not take any antihistamines 12 hours prior to your test.  On the Day of the Test: Drink plenty of water until 1 hour prior to the test. Do not eat any food 4 hours prior to the test. You may take your regular medications prior to the test.  Take metoprolol (Lopressor) two hours prior to test. HOLD Furosemide/Hydrochlorothiazide morning of the test.  FEMALES- please wear underwire-free bra if available, avoid dresses & tight clothing      After the Test: Drink plenty of water. After receiving IV contrast, you may experience a mild flushed feeling. This is normal. On occasion, you may experience a mild rash up to 24 hours after the test. This is not dangerous. If this occurs, you can take Benadryl 25  mg and increase your fluid intake. If you experience trouble breathing, this can be serious. If it is severe call 911 IMMEDIATELY. If it is mild, please call our office. If you take any of these medications: Glipizide/Metformin, Avandament, Glucavance, please do not take 48 hours after completing test unless otherwise instructed.  Please allow 2-4 weeks for scheduling of routine cardiac CTs. Some insurance companies require a pre-authorization which may delay scheduling of this test.   For non-scheduling related questions, please contact the cardiac imaging nurse navigator should you have any questions/concerns: Marchia Bond, Cardiac Imaging Nurse Navigator Gordy Clement, Cardiac Imaging Nurse Navigator [facility] Direct Office Dial: [phone]   For scheduling needs, including cancellations and rescheduling, please call [place], [phone].

## 2021-02-28 NOTE — Assessment & Plan Note (Signed)
Quit several years ago.  Excellent.

## 2021-02-28 NOTE — Assessment & Plan Note (Signed)
We will go ahead and check an echocardiogram to make sure he has normal structure and function.

## 2021-02-28 NOTE — Progress Notes (Signed)
Cardiology Office Note:    Date:  02/28/2021   ID:  Jordan Roth, DOB 11/02/1955, MRN [id number]  PCP:  Jordan Squibb, MD   [facility] Providers Cardiologist:  None     Referring MD: Jordan Lawrence, NP    History of Present Illness:    Jordan Roth is a 65 y.o. male here for the evaluation of chest pain at the request of Jordan Lawrence, NP/ Jordan Neighbors MD. past medical history of hyperlipidemia.  Also has nonobstructive coronary artery disease by cardiac catheterization in 2017 performed by Dr. Alethia Roth. Placed on Lipitor 20. Stopped for 30 days. Rash - had alpha-gal - no red meat, no pork.  Heart catheterization at that time took place after a small area of peri-infarct ischemia in a small area of mid to distal anterior wall ischemia was noted on a nuclear stress test.  EKG showed no ST segment deviation.  Echocardiogram at that time revealed normal ejection fraction with no valvular disease.  He originally had onset of shortness of breath with strenuous activity such as digging a ditch or carrying a board up a hill.  This time noted BP a bit high for him. Yesterday 197/111.   Has rental property and farm. When walking up hill giving out. Not as much exercise. Had prostate CA last year. Father and Mother died.  Very challenging year for him.  Energy level has been down.  Prior creatinine 1.3.  LDL 84 TSH 2.0 hemoglobin A1c 5.5   Past Medical History:  Diagnosis Date   Angina pectoris ([place]) 2017   ABNORMAL STRESS TEST CATH DONE AT [facility] RESULTS OK   Cancer ([place]) 2000   Pre-esophogeal   Chronic kidney disease    acute nephritis as a child    COPD (chronic obstructive pulmonary disease) ([place])    pt denies    Gout    History of kidney stones    Hyperlipidemia    Muscle cramps at night    Osteoarthrosis    OA   Prostate cancer ([place])    Raynaud's disease    fingers   Sleep apnea    cpap     Past Surgical History:  Procedure Laterality Date   BIOPSY  12/10/2018    Procedure: BIOPSY;  Surgeon: Rogene Houston, MD;  Location[facility];  Service: Endoscopy;;  esophagus   CARDIAC CATHETERIZATION  2017 [facility]   COLONOSCOPY     COLONOSCOPY WITH PROPOFOL N/A 12/10/2018   Procedure: COLONOSCOPY WITH PROPOFOL;  Surgeon: Rogene Houston, MD;  Location: [facility];  Service: Endoscopy;  Laterality: N/A;   ESOPHAGOGASTRODUODENOSCOPY (EGD) WITH PROPOFOL N/A 12/10/2018   Procedure: ESOPHAGOGASTRODUODENOSCOPY (EGD) WITH PROPOFOL;  Surgeon: Rogene Houston, MD;  Location: [facility];  Service: Endoscopy;  Laterality: N/A;  145pm-office moved to 12:35pm   LYMPHADENECTOMY Bilateral 03/22/2020   Procedure: LYMPHADENECTOMY, PELVIC;  Surgeon: Raynelle Bring, MD;  Location: [facility] ORS;  Service: Urology;  Laterality: Bilateral;   POLYPECTOMY  12/10/2018   Procedure: POLYPECTOMY;  Surgeon: Rogene Houston, MD;  Location[facility];  Service: Endoscopy;;  colon   right eye detached retina surgery      ROBOT ASSISTED LAPAROSCOPIC RADICAL PROSTATECTOMY N/A 03/22/2020   Procedure: XI ROBOTIC ASSISTED LAPAROSCOPIC RADICAL PROSTATECTOMY LEVEL 2;  Surgeon: Raynelle Bring, MD;  Location: [facility] ORS;  Service: Urology;  Laterality: N/A;   surgery for barretts esophagus  2000   surgery for kidney stone  2009 or 2010  TOTAL KNEE ARTHROPLASTY Right 01/11/2018   Procedure: RIGHT TOTAL KNEE ARTHROPLASTY;  Surgeon: Gaynelle Arabian, MD;  Location: [facility] OR[facility];  Service: Orthopedics;  Laterality: Right;    Current Medications: Current Meds  Medication Sig   aspirin 81 MG chewable tablet Chew by mouth.   metoprolol tartrate (LOPRESSOR) 25 MG tablet Take 1 tablet two (2) hours prior to CT Scan   Omega-3 Fatty Acids (FISH OIL) 1000 MG CAPS Take by mouth daily.   sildenafil (VIAGRA) 100 MG tablet Take 100 mg by mouth daily as needed for erectile dysfunction.     Allergies:   Cephalexin and Tetracyclines & related   Social History   Socioeconomic History   Marital status: Married     Spouse name: Not on file   Number of children: 2   Years of education: Not on file   Highest education level: Not on file  Occupational History    Comment: retired  Tobacco Use   Smoking status: Former    Packs/day: 1.00    Years: 10.00    Pack years: 10.00    Types: Cigarettes    Quit date: 12/16/1979    Years since quitting: 41.2   Smokeless tobacco: Never   Tobacco comments:    quit 1981  Vaping Use   Vaping Use: Never used  Substance and Sexual Activity   Alcohol use: Not Currently    Comment: no etoh use in one month did drink beer occas    Drug use: No   Sexual activity: Yes    Partners: Female  Other Topics Concern   Not on file  Social History Narrative   1 daughter. 1 son (deceased).   Social Determinants of Health   Financial Resource Strain: Not on file  Food Insecurity: Not on file  Transportation Needs: Not on file  Physical Activity: Not on file  Stress: Not on file  Social Connections: Not on file     Family History: The patient's family history includes Brain cancer in his maternal grandmother; COPD in his mother; Head & neck cancer in his maternal uncle; Healthy in his brother; Leukemia in his father; Prostate cancer in his paternal uncle; Skin cancer in his mother. There is no history of Colon cancer, Pancreatic cancer, or Breast cancer. Father had an MI in his 72s. ROS:   Please see the history of present illness.    No fevers chills nausea vomiting.  All other systems reviewed and are negative.  EKGs/Labs/Other Studies Reviewed:    The following studies were reviewed today:  2017 Right Heart Catheterization  Results: RA mean: 4 mmHg RV: 28/2 mmHg PA mean: 17 (28/9) mmHg PCWP mean: 7 mmHg AV O2 difference: 3.7 vol% Cardiac output: 6.6 L/min Cardiac index: 3.3 L/min-m2 PVR: 1.5 [place] units  Impressions and Recommendations: No pulmonary hypertension Good cardiac function   Left Heart Cardiac Catheterization   Results:  Coronary  arteries: Left main: 0% Left anterior descending: 20% mid Left circumflex: 20% distal CM-2 (N): 0% CM-3 (L): 0% LPL-1 (N): 0% LPL-2 (N): 0%% LPDA (N): 0% Right coronary: (non-dominant) 0%  Left ventriculogram: LVEDP: 13 mmHg  Impressions and Recommendations: Insignificant coronary artery disease No cardiac abnormality to explain SOB, or abnormal stress nuclear test Continue medical therapy   EKG:  EKG is  ordered today.  The ekg ordered today demonstrates sinus bradycardia 56 with no other abnormalities  Recent Labs: 03/14/2020: BUN 20; Creatinine, Ser 1.30; Platelets 267; Potassium 4.4; Sodium 140 03/23/2020: Hemoglobin 14.7  Recent  Lipid Panel No results found for: CHOL, TRIG, HDL, CHOLHDL, VLDL, LDLCALC, LDLDIRECT   Risk Assessment/Calculations:              Physical Exam:    VS:  BP 132/66    Pulse (!) 56    Ht 5\' 11"  (1.803 m)    Wt 195 lb (88.5 kg)    SpO2 96%    BMI 27.20 kg/m     Wt Readings from Last 3 Encounters:  02/28/21 195 lb (88.5 kg)  03/14/20 196 lb (88.9 kg)  02/28/20 201 lb 4 oz (91.3 kg)     GEN:  Well nourished, well developed in no acute distress HEENT: Normal NECK: No JVD; No carotid bruits LYMPHATICS: No lymphadenopathy CARDIAC: RRR, no murmurs, no rubs, gallops RESPIRATORY:  Clear to auscultation without rales, wheezing or rhonchi  ABDOMEN: Soft, non-tender, non-distended MUSCULOSKELETAL:  No edema; No deformity  SKIN: Warm and dry NEUROLOGIC:  Alert and oriented x 3 PSYCHIATRIC:  Normal affect   ASSESSMENT:    1. SOB (shortness of breath)   2. Angina pectoris ([place])   3. Former smoker   4. Pure hypercholesterolemia   5. Malignant neoplasm of prostate ([place])    PLAN:    In order of problems listed above:  Angina pectoris ([place]) Prior evaluation with heart catheterization in 2017 showed mid 20% LAD and mid 20% circumflex otherwise unremarkable.  LVEDP was 13 mmHg.  I think makes sense for [place] to proceed with coronary CT to make  sure there is been no significant progression of disease.  SOB (shortness of breath) We will go ahead and check an echocardiogram to make sure he has normal structure and function.  Former smoker Quit several years ago.  Excellent.  Hyperlipidemia Was recently holding atorvastatin because of rash.  He found out however that he had alpha gal.  Resume atorvastatin when felt comfortable to do so.  Malignant neoplasm of prostate ([place]) States that PSA went from 2-4 and then 5 when he saw the urologist.  He is thankful that this was caught.       Medication Adjustments/Labs and Tests Ordered: Current medicines are reviewed at length with the patient today.  Concerns regarding medicines are outlined above.  Orders Placed This Encounter  Procedures   CT CORONARY MORPH W/CTA COR W/SCORE W/CA W/CM &/OR WO/CM   Basic metabolic panel   EKG 35-HGDJ   ECHOCARDIOGRAM COMPLETE   Meds ordered this encounter  Medications   metoprolol tartrate (LOPRESSOR) 25 MG tablet    Sig: Take 1 tablet two (2) hours prior to CT Scan    Dispense:  1 tablet    Refill:  0     Patient Instructions  Medication Instructions:  Your physician recommends that you continue on your current medications as directed. Please refer to the Current Medication list given to you today.  *If you need a refill on your cardiac medications before your next appointment, please call your pharmacy*   Lab Work: BMET- Prior to CT Scan  If you have labs (blood work) drawn today and your tests are completely normal, you will receive your results only by: [place] (if you have MyChart) OR A paper copy in the mail If you have any lab test that is abnormal or we need to change your treatment, we will call you to review the results.   Testing/Procedures: Your physician has requested that you have an echocardiogram. Echocardiography is a painless test that uses sound waves to  create images of your heart. It provides your  doctor with information about the size and shape of your heart and how well your hearts chambers and valves are working. This procedure takes approximately one hour. There are no restrictions for this procedure.  Coronary CT Scan   Follow-Up: At [facility], you and your health needs are our priority.  As part of our continuing mission to provide you with exceptional heart care, we have created designated Provider Care Teams.  These Care Teams include your primary Cardiologist (physician) and Advanced Practice Providers (APPs -  Physician Assistants and Nurse Practitioners) who all work together to provide you with the care you need, when you need it.  We recommend signing up for the patient portal called "MyChart".  Sign up information is provided on this After Visit Summary.  MyChart is used to connect with patients for Virtual Visits (Telemedicine).  Patients are able to view lab/test results, encounter notes, upcoming appointments, etc.  Non-urgent messages can be sent to your provider as well.   To learn more about what you can do with MyChart, go to [email or web address].    Your next appointment:   Follow UP: As Needed   Other Instructions   Your cardiac CT will be scheduled at one of the below locations:   [facility] [address] [place], [place] [address] [phone]  [place] [address] [place], [place] [address] [phone]  If scheduled at [facility], please arrive at the [facility] main entrance (entrance A) of [facility] 30 minutes prior to test start time. You can use the FREE valet parking offered at the main entrance (encouraged to control the heart rate for the test) Proceed to the [facility] Radiology Department (first floor) to check-in and test prep.  If scheduled at [facility], please arrive 15 mins early for check-in and test  prep.  Please follow these instructions carefully (unless otherwise directed):  Hold all erectile dysfunction medications at least 3 days (72 hrs) prior to test. TAKE METOPROLOL TARTRATE 25 MG TABLET TWO (2) HOURS PRIOR TO CT SCAN   On the Night Before the Test: Be sure to Drink plenty of water. Do not consume any caffeinated/decaffeinated beverages or chocolate 12 hours prior to your test. Do not take any antihistamines 12 hours prior to your test.  On the Day of the Test: Drink plenty of water until 1 hour prior to the test. Do not eat any food 4 hours prior to the test. You may take your regular medications prior to the test.  Take metoprolol (Lopressor) two hours prior to test. HOLD Furosemide/Hydrochlorothiazide morning of the test. FEMALES- please wear underwire-free bra if available, avoid dresses & tight clothing      After the Test: Drink plenty of water. After receiving IV contrast, you may experience a mild flushed feeling. This is normal. On occasion, you may experience a mild rash up to 24 hours after the test. This is not dangerous. If this occurs, you can take Benadryl 25 mg and increase your fluid intake. If you experience trouble breathing, this can be serious. If it is severe call 911 IMMEDIATELY. If it is mild, please call our office. If you take any of these medications: Glipizide/Metformin, Avandament, Glucavance, please do not take 48 hours after completing test unless otherwise instructed.  Please allow 2-4 weeks for scheduling of routine cardiac CTs. Some insurance companies require a  pre-authorization which may delay scheduling of this test.   For non-scheduling related questions, please contact the cardiac imaging nurse navigator should you have any questions/concerns: Marchia Bond, Cardiac Imaging Nurse Navigator Gordy Clement, Cardiac Imaging Nurse Navigator [facility] Direct Office Dial: [phone]   For scheduling needs,  including cancellations and rescheduling, please call [place], [phone].    Signed, Candee Furbish, MD  02/28/2021 4:38 PM    [place]

## 2021-02-28 NOTE — Assessment & Plan Note (Signed)
Was recently holding atorvastatin because of rash.  He found out however that he had alpha gal.  Resume atorvastatin when felt comfortable to do so.

## 2021-02-28 NOTE — Assessment & Plan Note (Signed)
States that PSA went from 2-4 and then 5 when he saw the urologist.  He is thankful that this was caught.

## 2021-02-28 NOTE — Assessment & Plan Note (Signed)
Prior evaluation with heart catheterization in 2017 showed mid 20% LAD and mid 20% circumflex otherwise unremarkable.  LVEDP was 13 mmHg.  I think makes sense for [place] to proceed with coronary CT to make sure there is been no significant progression of disease.

## 2021-03-01 ENCOUNTER — Other Ambulatory Visit (HOSPITAL_COMMUNITY)
Admission: RE | Admit: 2021-03-01 | Discharge: 2021-03-01 | Disposition: A | Discharge status: Home / Self Care | Payer: Medicare Other | Source: Ambulatory Visit | Attending: Cardiology | Admitting: Cardiology

## 2021-03-01 DIAGNOSIS — I209 Angina pectoris, unspecified: Secondary | ICD-10-CM | POA: Diagnosis present

## 2021-03-01 LAB — BASIC METABOLIC PANEL
Anion gap: 8 (ref 5–15)
BUN: 18 mg/dL (ref 8–23)
CO2: 27 mmol/L (ref 22–32)
Calcium: 8.9 mg/dL (ref 8.9–10.3)
Chloride: 103 mmol/L (ref 98–111)
Creatinine, Ser: 1.44 mg/dL — ABNORMAL HIGH (ref 0.61–1.24)
GFR, Estimated: 54 mL/min — ABNORMAL LOW (ref >60)
Glucose, Bld: 100 mg/dL — ABNORMAL HIGH (ref 70–99)
Potassium: 4.6 mmol/L (ref 3.5–5.1)
Sodium: 138 mmol/L (ref 135–145)

## 2021-03-13 ENCOUNTER — Telehealth (HOSPITAL_COMMUNITY): Payer: Self-pay | Admitting: Emergency Medicine

## 2021-03-13 NOTE — Telephone Encounter (Signed)
Reaching out to patient to offer assistance regarding upcoming cardiac imaging study; pt verbalizes understanding of appt date/time, parking situation and where to check in, pre-test NPO status and medications ordered, and verified current allergies; name and call back number provided for further questions should they arise Marchia Bond RN Navigator Cardiac Imaging [facility] [phone] office [phone] cell  Arrival 715 25mg  metoprolol  Denies iv issues Holding daily meds

## 2021-03-15 ENCOUNTER — Other Ambulatory Visit: Payer: Self-pay

## 2021-03-15 ENCOUNTER — Ambulatory Visit (HOSPITAL_COMMUNITY)
Admission: RE | Admit: 2021-03-15 | Discharge: 2021-03-15 | Disposition: A | Discharge status: Home / Self Care | Payer: Medicare Other | Source: Ambulatory Visit | Attending: Cardiology | Admitting: Cardiology

## 2021-03-15 DIAGNOSIS — I209 Angina pectoris, unspecified: Secondary | ICD-10-CM | POA: Diagnosis present

## 2021-03-15 MED ORDER — IOHEXOL 350 MG/ML SOLN
100.0000 mL | Freq: Once | INTRAVENOUS | Status: AC | PRN
  Administered 2021-03-15: 08:00:00 100 mL via INTRAVENOUS

## 2021-03-15 MED ORDER — NITROGLYCERIN 0.4 MG SL SUBL
SUBLINGUAL_TABLET | SUBLINGUAL | Status: AC
  Filled 2021-03-15: qty 2

## 2021-03-15 MED ORDER — NITROGLYCERIN 0.4 MG SL SUBL
0.8000 mg | SUBLINGUAL_TABLET | Freq: Once | SUBLINGUAL | Status: AC
  Administered 2021-03-15: 08:00:00 0.8 mg via SUBLINGUAL

## 2021-04-08 ENCOUNTER — Ambulatory Visit: Payer: Medicare Other | Admitting: Cardiology

## 2021-04-10 ENCOUNTER — Other Ambulatory Visit: Payer: Self-pay

## 2021-04-10 ENCOUNTER — Ambulatory Visit (HOSPITAL_COMMUNITY)
Admission: RE | Admit: 2021-04-10 | Discharge: 2021-04-10 | Disposition: A | Discharge status: Home / Self Care | Payer: Medicare Other | Source: Ambulatory Visit | Attending: Cardiology | Admitting: Cardiology

## 2021-04-10 DIAGNOSIS — R0602 Shortness of breath: Secondary | ICD-10-CM | POA: Insufficient documentation

## 2021-04-10 LAB — ECHOCARDIOGRAM COMPLETE
Area-P 1/2: 3.6 cm2
S' Lateral: 2.9 cm

## 2021-04-10 NOTE — Progress Notes (Signed)
*  PRELIMINARY RESULTS* Echocardiogram 2D Echocardiogram has been performed.  Jordan Roth 04/10/2021, 10:09 AM

## 2021-06-24 ENCOUNTER — Ambulatory Visit
Admission: EM | Admit: 2021-06-24 | Discharge: 2021-06-24 | Disposition: A | Discharge status: Home / Self Care | Payer: Medicare Other | Attending: Urgent Care | Admitting: Urgent Care

## 2021-06-24 DIAGNOSIS — H6123 Impacted cerumen, bilateral: Secondary | ICD-10-CM

## 2021-06-24 MED ORDER — CARBAMIDE PEROXIDE 6.5 % OT SOLN: 5.0000 [drp] | Freq: Two times a day (BID) | OTIC | 0 refills | Status: AC

## 2021-06-24 NOTE — Discharge Instructions (Addendum)
Please make sure you avoid the use of any particular Q-tips inside the ear.  If you ever get trouble with earwax buildup again you can try Debrox as I sent a prescription for this over-the-counter medication that can help. ?

## 2021-06-24 NOTE — ED Provider Notes (Signed)
?[place] ? ? ?MRN: [id number] DOB: [birth year] ? ?Subjective:  ? ?Jordan Roth is a 66 y.o. male presenting for 2 to 3-day history of acute onset bilateral ear fullness, decreased hearing worse on the left.  No fever, drainage, dizziness, tinnitus.  Patient did try peroxide to clean his hearing last night but was not able to get much earwax out. ? ?No current facility-administered medications for this encounter. ? ?Current Outpatient Medications:  ?  aspirin 81 MG chewable tablet, Chew by mouth., Disp: , Rfl:  ?  atorvastatin (LIPITOR) 20 MG tablet, Take 20 mg by mouth at bedtime.  (Patient not taking: Reported on 02/28/2021), Disp: , Rfl:  ?  EPINEPHrine 0.3 mg/0.3 mL IJ SOAJ injection, Inject into the muscle as needed., Disp: , Rfl:  ?  metoprolol tartrate (LOPRESSOR) 25 MG tablet, Take 1 tablet two (2) hours prior to CT Scan, Disp: 1 tablet, Rfl: 0 ?  Omega-3 Fatty Acids (FISH OIL) 1000 MG CAPS, Take by mouth daily., Disp: , Rfl:  ?  sildenafil (VIAGRA) 100 MG tablet, Take 100 mg by mouth daily as needed for erectile dysfunction., Disp: , Rfl:  ?  sulfamethoxazole-trimethoprim (BACTRIM DS) 800-160 MG tablet, Take 1 tablet by mouth 2 (two) times daily. Start the day prior to foley removal appointment (Patient not taking: Reported on 02/28/2021), Disp: 6 tablet, Rfl: 0 ?  traMADol (ULTRAM) 50 MG tablet, Take 1-2 tablets (50-100 mg total) by mouth every 6 (six) hours as needed for moderate pain or severe pain. (Patient not taking: Reported on 02/28/2021), Disp: 20 tablet, Rfl: 0  ? ?Allergies  ?Allergen Reactions  ? Cephalexin Hives  ? Tetracyclines & Related Rash  ? ? ?Past Medical History:  ?Diagnosis Date  ? Angina pectoris ([place]) 2017  ? ABNORMAL STRESS TEST CATH DONE AT [facility] RESULTS OK  ? Cancer ([place]) 2000  ? Pre-esophogeal  ? Chronic kidney disease   ? acute nephritis as a child   ? COPD (chronic obstructive pulmonary disease) ([place])   ? pt denies   ? Gout   ? History of kidney stones   ?  Hyperlipidemia   ? Muscle cramps at night   ? Osteoarthrosis   ? OA  ? Prostate cancer ([place])   ? Raynaud's disease   ? fingers  ? Sleep apnea   ? cpap   ?  ? ?Past Surgical History:  ?Procedure Laterality Date  ? BIOPSY  12/10/2018  ? Procedure: BIOPSY;  Surgeon: Rogene Houston, MD;  [facility];  Service: Endoscopy;;  esophagus  ? CARDIAC CATHETERIZATION  2017 [facility]  ? COLONOSCOPY    ? COLONOSCOPY WITH PROPOFOL N/A 12/10/2018  ? Procedure: COLONOSCOPY WITH PROPOFOL;  Surgeon: Rogene Houston, MD;  [facility]: [facility];  Service: Endoscopy;  Laterality: N/A;  ? ESOPHAGOGASTRODUODENOSCOPY (EGD) WITH PROPOFOL N/A 12/10/2018  ? Procedure: ESOPHAGOGASTRODUODENOSCOPY (EGD) WITH PROPOFOL;  Surgeon: Rogene Houston, MD;  [facility]: [facility];  Service: Endoscopy;  Laterality: N/A;  145pm-office moved to 12:35pm  ? LYMPHADENECTOMY Bilateral 03/22/2020  ? Procedure: LYMPHADENECTOMY, PELVIC;  Surgeon: Raynelle Bring, MD;  Location: [facility];  Service: Urology;  Laterality: Bilateral;  ? POLYPECTOMY  12/10/2018  ? Procedure: POLYPECTOMY;  Surgeon: Rogene Houston, MD;  [facility]: [facility];  Service: Endoscopy;;  colon  ? right eye detached retina surgery     ? ROBOT ASSISTED LAPAROSCOPIC RADICAL PROSTATECTOMY N/A 03/22/2020  ? Procedure: XI ROBOTIC ASSISTED LAPAROSCOPIC RADICAL PROSTATECTOMY LEVEL 2;  Surgeon: Raynelle Bring, MD;  Location: [facility];  Service: Urology;  Laterality: N/A;  ? surgery for barretts esophagus  2000  ? surgery for kidney stone  2009 or 2010  ? TOTAL KNEE ARTHROPLASTY Right 01/11/2018  ? Procedure: RIGHT TOTAL KNEE ARTHROPLASTY;  Surgeon: Gaynelle Arabian, MD;  Location: [facility];  Service: Orthopedics;  Laterality: Right;  ? ? ?Family History  ?Problem Relation Age of Onset  ? COPD Mother   ? Skin cancer Mother   ? Leukemia Father   ? Healthy Brother   ? Head & neck cancer Maternal Uncle   ? Prostate cancer Paternal Uncle   ? Brain cancer Maternal Grandmother   ? Colon cancer Neg Hx   ?  Pancreatic cancer Neg Hx   ? Breast cancer Neg Hx   ? ? ?Social History  ? ?Tobacco Use  ? Smoking status: Former  ?  Packs/day: 1.00  ?  Years: 10.00  ?  Pack years: 10.00  ?  Types: Cigarettes  ?  Quit date: 12/16/1979  ?  Years since quitting: 41.5  ? Smokeless tobacco: Never  ? Tobacco comments:  ?  quit 1981  ?Vaping Use  ? Vaping Use: Never used  ?Substance Use Topics  ? Alcohol use: Not Currently  ?  Comment: no etoh use in one month did drink beer occas   ? Drug use: No  ? ? ?ROS ? ? ?Objective:  ? ?Vitals: ?BP 135/78   Pulse (!) 56   Temp 97.9 ?F (36.6 ?C)   Resp 18   SpO2 94%  ? ?Physical Exam ?Constitutional:   ?   General: He is not in acute distress. ?   Appearance: Normal appearance. He is well-developed and normal weight. He is not ill-appearing, toxic-appearing or diaphoretic.  ?HENT:  ?   Head: Normocephalic and atraumatic.  ?   Right Ear: Tympanic membrane, ear canal and external ear normal. There is impacted cerumen.  ?   Left Ear: Tympanic membrane, ear canal and external ear normal. There is impacted cerumen.  ?   Nose: Nose normal.  ?   Mouth/Throat:  ?   Pharynx: Oropharynx is clear.  ?Eyes:  ?   General: No scleral icterus.    ?   Right eye: No discharge.     ?   Left eye: No discharge.  ?   Extraocular Movements: Extraocular movements intact.  ?Cardiovascular:  ?   Rate and Rhythm: Normal rate.  ?Pulmonary:  ?   Effort: Pulmonary effort is normal.  ?Musculoskeletal:  ?   Cervical back: Normal range of motion.  ?Neurological:  ?   Mental Status: He is alert and oriented to person, place, and time.  ?Psychiatric:     ?   Mood and Affect: Mood normal.     ?   Behavior: Behavior normal.     ?   Thought Content: Thought content normal.     ?   Judgment: Judgment normal.  ? ? ?Ear lavage performed using mixture of peroxide and water.  Pressure irrigation performed using a bottle and a thin ear tube.  Bilateral ear lavage with curette used for manual removal. ? ? ?Assessment and Plan :  ? ?PDMP  not reviewed this encounter. ? ?1. Bilateral impacted cerumen   ?2. Hearing loss due to cerumen impaction, bilateral   ? ?Successful bilateral ear lavage.  General management of cerumen impaction reviewed with patient.  Anticipatory guidance provided. Counseled patient on potential for adverse  effects with medications prescribed/recommended today, ER and return-to-clinic precautions discussed, patient verbalized understanding. ? ?  ?Jaynee Eagles, PA-C ?06/24/21 1106 ? ?

## 2021-06-24 NOTE — ED Triage Notes (Signed)
Pt presents with bilateral ear fullness, used peroxide to attempt to clean last night  but unable to remove much cerumen  ?

## 2023-11-26 ENCOUNTER — Encounter (INDEPENDENT_AMBULATORY_CARE_PROVIDER_SITE_OTHER): Payer: Self-pay | Admitting: *Deleted
